# Patient Record
Sex: Male | Born: 1985 | Race: Black or African American | Hispanic: No | Marital: Single | Smoking: Never smoker
Health system: Southern US, Community
[De-identification: ages and names within clinical notes are randomized; demographics above are authoritative.]

## PROBLEM LIST (undated history)

## (undated) DIAGNOSIS — F419 Anxiety disorder, unspecified: Secondary | ICD-10-CM

---
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
## 2004-05-03 ENCOUNTER — Emergency Department (HOSPITAL_COMMUNITY): Admission: EM | Admit: 2004-05-03 | Discharge: 2004-05-03 | Payer: Self-pay

## 2004-05-18 ENCOUNTER — Emergency Department (HOSPITAL_COMMUNITY): Admission: EM | Admit: 2004-05-18 | Discharge: 2004-05-18 | Payer: Self-pay | Admitting: Emergency Medicine

## 2004-12-18 ENCOUNTER — Emergency Department (HOSPITAL_COMMUNITY): Admission: EM | Admit: 2004-12-18 | Discharge: 2004-12-18 | Payer: Self-pay | Admitting: *Deleted

## 2005-02-20 ENCOUNTER — Ambulatory Visit: Payer: Self-pay | Admitting: Family Medicine

## 2005-09-24 ENCOUNTER — Ambulatory Visit: Payer: Self-pay | Admitting: Sports Medicine

## 2006-05-08 ENCOUNTER — Emergency Department (HOSPITAL_COMMUNITY): Admission: EM | Admit: 2006-05-08 | Discharge: 2006-05-08 | Payer: Self-pay | Admitting: Family Medicine

## 2006-05-22 ENCOUNTER — Emergency Department (HOSPITAL_COMMUNITY): Admission: EM | Admit: 2006-05-22 | Discharge: 2006-05-22 | Payer: Self-pay | Admitting: Emergency Medicine

## 2006-07-08 ENCOUNTER — Emergency Department (HOSPITAL_COMMUNITY): Admission: EM | Admit: 2006-07-08 | Discharge: 2006-07-08 | Payer: Self-pay | Admitting: Emergency Medicine

## 2006-07-09 ENCOUNTER — Emergency Department (HOSPITAL_COMMUNITY): Admission: EM | Admit: 2006-07-09 | Discharge: 2006-07-09 | Payer: Self-pay | Admitting: Emergency Medicine

## 2006-11-01 ENCOUNTER — Emergency Department (HOSPITAL_COMMUNITY): Admission: EM | Admit: 2006-11-01 | Discharge: 2006-11-01 | Payer: Self-pay | Admitting: Emergency Medicine

## 2006-11-02 ENCOUNTER — Emergency Department (HOSPITAL_COMMUNITY): Admission: EM | Admit: 2006-11-02 | Discharge: 2006-11-02 | Payer: Self-pay | Admitting: Emergency Medicine

## 2006-12-23 DIAGNOSIS — J4540 Moderate persistent asthma, uncomplicated: Secondary | ICD-10-CM

## 2007-11-23 ENCOUNTER — Emergency Department (HOSPITAL_COMMUNITY): Admission: EM | Admit: 2007-11-23 | Discharge: 2007-11-23 | Payer: Self-pay | Admitting: Emergency Medicine

## 2009-02-08 ENCOUNTER — Emergency Department (HOSPITAL_COMMUNITY): Admission: EM | Admit: 2009-02-08 | Discharge: 2009-02-08 | Payer: Self-pay | Admitting: Emergency Medicine

## 2009-04-29 ENCOUNTER — Emergency Department (HOSPITAL_COMMUNITY): Admission: EM | Admit: 2009-04-29 | Discharge: 2009-04-29 | Payer: Self-pay | Admitting: Emergency Medicine

## 2009-05-02 ENCOUNTER — Emergency Department (HOSPITAL_COMMUNITY): Admission: EM | Admit: 2009-05-02 | Discharge: 2009-05-02 | Payer: Self-pay | Admitting: Family Medicine

## 2010-05-20 ENCOUNTER — Emergency Department (HOSPITAL_COMMUNITY): Admission: EM | Admit: 2010-05-20 | Discharge: 2010-05-20 | Payer: Self-pay | Admitting: Family Medicine

## 2011-02-01 LAB — CULTURE, ROUTINE-ABSCESS

## 2011-06-25 ENCOUNTER — Emergency Department (HOSPITAL_COMMUNITY): Payer: Self-pay

## 2011-06-25 ENCOUNTER — Emergency Department (HOSPITAL_COMMUNITY)
Admission: EM | Admit: 2011-06-25 | Discharge: 2011-06-25 | Disposition: A | Discharge status: Home / Self Care | Payer: Self-pay | Attending: Emergency Medicine | Admitting: Emergency Medicine

## 2011-06-25 DIAGNOSIS — R110 Nausea: Secondary | ICD-10-CM | POA: Insufficient documentation

## 2011-06-25 DIAGNOSIS — K5900 Constipation, unspecified: Secondary | ICD-10-CM | POA: Insufficient documentation

## 2011-06-25 DIAGNOSIS — R109 Unspecified abdominal pain: Secondary | ICD-10-CM | POA: Insufficient documentation

## 2011-06-25 LAB — COMPREHENSIVE METABOLIC PANEL
ALT: 27 U/L (ref 0–53)
AST: 24 U/L (ref 0–37)
Albumin: 3.8 g/dL (ref 3.5–5.2)
Alkaline Phosphatase: 53 U/L (ref 39–117)
BUN: 22 mg/dL (ref 6–23)
CO2: 28 mEq/L (ref 19–32)
Calcium: 9.4 mg/dL (ref 8.4–10.5)
Chloride: 104 mEq/L (ref 96–112)
Creatinine, Ser: 0.78 mg/dL (ref 0.50–1.35)
GFR calc Af Amer: >60 mL/min (ref >60)
GFR calc non Af Amer: >60 mL/min (ref >60)
Glucose, Bld: 93 mg/dL (ref 70–99)
Potassium: 3.4 mEq/L — ABNORMAL LOW (ref 3.5–5.1)
Sodium: 140 mEq/L (ref 135–145)
Total Bilirubin: 0.8 mg/dL (ref 0.3–1.2)
Total Protein: 7.5 g/dL (ref 6.0–8.3)

## 2011-06-25 LAB — LIPASE, BLOOD: Lipase: 25 U/L (ref 11–59)

## 2011-06-25 LAB — CBC
HCT: 38.9 % — ABNORMAL LOW (ref 39.0–52.0)
Hemoglobin: 14.2 g/dL (ref 13.0–17.0)
MCH: 28.7 pg (ref 26.0–34.0)
MCHC: 36.5 g/dL — ABNORMAL HIGH (ref 30.0–36.0)
MCV: 78.6 fL (ref 78.0–100.0)
Platelets: 262 10*3/uL (ref 150–400)
RBC: 4.95 MIL/uL (ref 4.22–5.81)
RDW: 14 % (ref 11.5–15.5)
WBC: 7.5 10*3/uL (ref 4.0–10.5)

## 2011-07-16 LAB — DIFFERENTIAL
Basophils Absolute: 0
Basophils Relative: 0
Eosinophils Absolute: 0.1
Eosinophils Relative: 1
Lymphocytes Relative: 14
Lymphs Abs: 1.1
Monocytes Absolute: 0.5
Monocytes Relative: 6
Neutro Abs: 6
Neutrophils Relative %: 79 — ABNORMAL HIGH

## 2011-07-16 LAB — URINALYSIS, ROUTINE W REFLEX MICROSCOPIC
Bilirubin Urine: NEGATIVE
Glucose, UA: NEGATIVE
Hgb urine dipstick: NEGATIVE
Ketones, ur: 40 — AB
Nitrite: NEGATIVE
Protein, ur: 30 — AB
Specific Gravity, Urine: 1.033 — ABNORMAL HIGH
Urobilinogen, UA: 1
pH: 8.5 — ABNORMAL HIGH

## 2011-07-16 LAB — CBC
HCT: 44.2
Hemoglobin: 15.5
MCHC: 35.2
MCV: 81.8
Platelets: 268
RBC: 5.4
RDW: 14.4
WBC: 7.6

## 2011-07-16 LAB — COMPREHENSIVE METABOLIC PANEL
ALT: 19
AST: 18
Albumin: 4.3
Alkaline Phosphatase: 41
BUN: 19
CO2: 28
Calcium: 9.1
Chloride: 103
Creatinine, Ser: 0.88
GFR calc Af Amer: >60
GFR calc non Af Amer: >60
Glucose, Bld: 90
Potassium: 4
Sodium: 138
Total Bilirubin: 1.4 — ABNORMAL HIGH
Total Protein: 7.4

## 2011-07-16 LAB — URINE MICROSCOPIC-ADD ON

## 2011-07-16 LAB — LIPASE, BLOOD: Lipase: 19

## 2011-11-05 ENCOUNTER — Emergency Department (HOSPITAL_COMMUNITY)
Admission: EM | Admit: 2011-11-05 | Discharge: 2011-11-05 | Discharge status: Against Medical Advice | Payer: Self-pay | Attending: Emergency Medicine | Admitting: Emergency Medicine

## 2011-11-05 ENCOUNTER — Emergency Department (HOSPITAL_COMMUNITY)
Admission: EM | Admit: 2011-11-05 | Discharge: 2011-11-05 | Disposition: A | Discharge status: Home / Self Care | Payer: Self-pay | Attending: Emergency Medicine | Admitting: Emergency Medicine

## 2011-11-05 ENCOUNTER — Other Ambulatory Visit: Payer: Self-pay

## 2011-11-05 ENCOUNTER — Encounter (HOSPITAL_COMMUNITY): Payer: Self-pay | Admitting: *Deleted

## 2011-11-05 DIAGNOSIS — R030 Elevated blood-pressure reading, without diagnosis of hypertension: Secondary | ICD-10-CM | POA: Insufficient documentation

## 2011-11-05 DIAGNOSIS — R51 Headache: Secondary | ICD-10-CM | POA: Insufficient documentation

## 2011-11-05 MED ORDER — KETOROLAC TROMETHAMINE 30 MG/ML IJ SOLN
30.0000 mg | Freq: Once | INTRAMUSCULAR | Status: AC
  Administered 2011-11-05: 30 mg via INTRAVENOUS
  Filled 2011-11-05: qty 1

## 2011-11-05 NOTE — ED Notes (Signed)
Patient is resting comfortably. 

## 2011-11-05 NOTE — ED Notes (Signed)
MD at bedside. 

## 2011-11-05 NOTE — ED Notes (Signed)
Pt states he has had a headache for about a week. Pt states last time he went doctor he was dx with htn. Pt denies chest pain or n/v

## 2011-11-05 NOTE — ED Notes (Signed)
Vital signs stable. 

## 2011-11-05 NOTE — ED Provider Notes (Signed)
History     CSN: [id number]  Arrival date & time 11/05/11  1114   First MD Initiated Contact with Patient 11/05/11 1313      Chief Complaint  Patient presents with  . Hypertension    (Consider location/radiation/quality/duration/timing/severity/associated sxs/prior treatment) Patient is a 26 y.o. male presenting with headaches. The history is provided by the patient.  Headache  This is a recurrent problem. The current episode started more than 2 days ago. The problem occurs constantly. The problem has not changed since onset.The headache is associated with nothing. The pain is located in the temporal and frontal region. The quality of the pain is described as throbbing and dull. The pain is moderate. The pain does not radiate. Pertinent negatives include no anorexia, no fever, no malaise/fatigue, no chest pressure, no near-syncope, no orthopnea, no palpitations, no syncope, no shortness of breath, no nausea and no vomiting. He has tried nothing for the symptoms.   Pt states he was seen at the health dept about a week ago and was told that his BP was high; he was not started on any meds. Has not checked his BP at home since then. States that since that time he has had intermittent headaches which are described as dull and achy in nature. He denies any nausea, vomiting, photophobia, phonophobia, visual disturbance. No hx migraine, chronic HAs. He has not taken any medications at home.  Past Medical History  Diagnosis Date  . Hypertension     History reviewed. No pertinent past surgical history.  No family history on file.  History  Substance Use Topics  . Smoking status: Never Smoker   . Smokeless tobacco: Not on file  . Alcohol Use: Yes     occa      Review of Systems  Constitutional: Negative for fever, chills, malaise/fatigue, activity change, appetite change and fatigue.  HENT: Negative for congestion, sore throat, facial swelling, rhinorrhea, trouble swallowing, neck  pain, neck stiffness and tinnitus.   Eyes: Negative for photophobia and visual disturbance.  Respiratory: Negative for chest tightness and shortness of breath.   Cardiovascular: Negative for chest pain, palpitations, orthopnea, syncope and near-syncope.  Gastrointestinal: Negative for nausea, vomiting, abdominal pain and anorexia.  Musculoskeletal: Negative for myalgias.  Neurological: Positive for headaches. Negative for dizziness, speech difficulty, light-headedness and numbness.    Allergies  Review of patient's allergies indicates no known allergies.  Home Medications  No current outpatient prescriptions on file.  BP 160/84  Pulse 74  Temp 98.2 F (36.8 C)  Resp 20  Ht 5\' 7"  (1.702 m)  Wt 160 lb (72.576 kg)  BMI 25.06 kg/m2  SpO2 100%  Physical Exam  Nursing note and vitals reviewed. Constitutional: He is oriented to person, place, and time. He appears well-developed and well-nourished. No distress.  HENT:  Head: Normocephalic and atraumatic.  Mouth/Throat: Oropharynx is clear and moist. No oropharyngeal exudate.  Eyes: Conjunctivae and EOM are normal. Pupils are equal, round, and reactive to light. Right eye exhibits no discharge. Left eye exhibits no discharge.  Neck: Normal range of motion.  Cardiovascular: Normal rate, regular rhythm and normal heart sounds.   No murmur heard. Pulmonary/Chest: Effort normal and breath sounds normal. No respiratory distress. He exhibits no tenderness.  Musculoskeletal: Normal range of motion.  Neurological: He is alert and oriented to person, place, and time. No cranial nerve deficit. Coordination normal.  Skin: Skin is warm and dry. He is not diaphoretic.  Psychiatric: He has a normal mood and affect.  ED Course  Procedures (including critical care time)   Date: 11/05/2011  Rate: 88  Rhythm: normal sinus rhythm  QRS Axis: right  Intervals: normal  ST/T Wave abnormalities: normal  Conduction Disutrbances:none  Narrative  Interpretation:   Old EKG Reviewed: as compared with July 2005 no significant changes  Labs Reviewed - No data to display No results found.   1. Headache       MDM  Pt with sx consistent with tension HA; this has been intermittent in nature over the past week. Per pt, BP measured at urgent care was elevated about a week ago. On exam here, no focal neuro findings. He was given Toradol and responded well to this. BP on d/c was 126/80. Discussed establishing care with a PCP for f/u. Return precautions discussed. Pt verbalized understanding and agreed to plan.       Grant Fontana, [place] 11/05/11 1452

## 2011-11-05 NOTE — ED Notes (Signed)
Patient denies pain and is resting comfortably.  

## 2011-11-06 NOTE — ED Provider Notes (Signed)
Evaluation and management procedures were performed by the PA/NP under my supervision/collaboration.    Felisa Bonier, MD 11/06/11 1725

## 2011-12-11 ENCOUNTER — Emergency Department (INDEPENDENT_AMBULATORY_CARE_PROVIDER_SITE_OTHER)
Admission: EM | Admit: 2011-12-11 | Discharge: 2011-12-11 | Disposition: A | Discharge status: Home / Self Care | Payer: Self-pay | Source: Home / Self Care

## 2011-12-11 ENCOUNTER — Encounter (HOSPITAL_COMMUNITY): Payer: Self-pay | Admitting: Cardiology

## 2011-12-11 DIAGNOSIS — J029 Acute pharyngitis, unspecified: Secondary | ICD-10-CM

## 2011-12-11 MED ORDER — IBUPROFEN 800 MG PO TABS: 800.0000 mg | ORAL_TABLET | Freq: Three times a day (TID) | ORAL | Status: DC

## 2011-12-11 NOTE — Discharge Instructions (Signed)
Take Ibuprofen as prescribed for discomfort. You may also use Chloraseptic spray for your throat discomfort. Increase fluids.  Return if symptoms change or worsen.

## 2011-12-11 NOTE — ED Provider Notes (Signed)
History     CSN: [id number]  Arrival date & time 12/11/11  4098   None     Chief Complaint  Patient presents with  . Sore Throat  . Nasal Congestion    (Consider location/radiation/quality/duration/timing/severity/associated sxs/prior treatment) HPI Comments: Pt presents today with c/o sore throat and nasal congestion. He states his primary concern is his sore throat and the nasal congestion is mild. He is concerned he has strep throat. This feels like a strep throat infection he had in the past. Pain worsens with swallowing. He girlfriend currently has a sore throat also. No fever, chills, or cough. No relief with otc cold medication.    Past Medical History  Diagnosis Date  . Hypertension   . Asthma     History reviewed. No pertinent past surgical history.  Family History  Problem Relation Age of Onset  . Hypertension Other   . Diabetes Other     History  Substance Use Topics  . Smoking status: Never Smoker   . Smokeless tobacco: Not on file  . Alcohol Use: Yes     occa      Review of Systems  Constitutional: Negative for fever, chills and fatigue.  HENT: Positive for congestion, sore throat and rhinorrhea. Negative for ear pain, sneezing, postnasal drip and sinus pressure.   Respiratory: Negative for cough, shortness of breath and wheezing.   Cardiovascular: Negative for chest pain.    Allergies  Review of patient's allergies indicates no known allergies.  Home Medications   Current Outpatient Rx  Name Route Sig Dispense Refill  . IBUPROFEN 800 MG PO TABS Oral Take 1 tablet (800 mg total) by mouth 3 (three) times daily. 15 tablet 0    There were no vitals taken for this visit.  Physical Exam  Nursing note and vitals reviewed. Constitutional: He appears well-developed and well-nourished. No distress.  HENT:  Head: Normocephalic and atraumatic.  Right Ear: Tympanic membrane, external ear and ear canal normal.  Left Ear: Tympanic membrane, external  ear and ear canal normal.  Nose: Nose normal.  Mouth/Throat: Uvula is midline and mucous membranes are normal. Posterior oropharyngeal erythema present. No oropharyngeal exudate or posterior oropharyngeal edema.  Neck: Neck supple.  Cardiovascular: Normal rate, regular rhythm and normal heart sounds.   Pulmonary/Chest: Effort normal and breath sounds normal. No respiratory distress.  Lymphadenopathy:    He has no cervical adenopathy.  Neurological: He is alert.  Skin: Skin is warm and dry.  Psychiatric: He has a normal mood and affect.    ED Course  Procedures (including critical care time)   Labs Reviewed  POCT RAPID STREP A ([facility] ONLY)   No results found.   1. Acute pharyngitis       MDM  Rapid strep neg.  Discussed with pt that this appears to be a viral throat infection. No abx rx at this time. Return if symptoms change or worsen.          Melody Comas, [place] 12/11/11 1102

## 2011-12-11 NOTE — ED Notes (Signed)
Sore throat and nasal congestion since this past Wednesday. Tried OTC liquid for flu symptoms with no relief. Denies fever. Tolerating PO intake.

## 2011-12-11 NOTE — ED Provider Notes (Signed)
Medical screening examination/treatment/procedure(s) were performed by non-physician practitioner and as supervising physician I was immediately available for consultation/collaboration.  Izan Miron M. MD   Shelli Portilla M Manaal Mandala, MD 12/11/11 2123 

## 2011-12-14 ENCOUNTER — Emergency Department (HOSPITAL_COMMUNITY)
Admission: EM | Admit: 2011-12-14 | Discharge: 2011-12-14 | Disposition: A | Discharge status: Home Health | Payer: Self-pay | Attending: Emergency Medicine | Admitting: Emergency Medicine

## 2011-12-14 ENCOUNTER — Encounter (HOSPITAL_COMMUNITY): Payer: Self-pay | Admitting: Emergency Medicine

## 2011-12-14 DIAGNOSIS — J45909 Unspecified asthma, uncomplicated: Secondary | ICD-10-CM | POA: Insufficient documentation

## 2011-12-14 DIAGNOSIS — J029 Acute pharyngitis, unspecified: Secondary | ICD-10-CM | POA: Insufficient documentation

## 2011-12-14 DIAGNOSIS — J069 Acute upper respiratory infection, unspecified: Secondary | ICD-10-CM | POA: Insufficient documentation

## 2011-12-14 DIAGNOSIS — R059 Cough, unspecified: Secondary | ICD-10-CM | POA: Insufficient documentation

## 2011-12-14 DIAGNOSIS — I10 Essential (primary) hypertension: Secondary | ICD-10-CM | POA: Insufficient documentation

## 2011-12-14 DIAGNOSIS — R05 Cough: Secondary | ICD-10-CM | POA: Insufficient documentation

## 2011-12-14 DIAGNOSIS — J3489 Other specified disorders of nose and nasal sinuses: Secondary | ICD-10-CM | POA: Insufficient documentation

## 2011-12-14 NOTE — ED Notes (Signed)
Pt came to get work note for extra days. St's he still has a cough and runny nose

## 2011-12-14 NOTE — Discharge Instructions (Signed)

## 2011-12-14 NOTE — ED Notes (Signed)
Pt. Came in for a work note extension.  States he got a note to return to work today but was still not feeling well. I told he would have to check back in and see the doctor to get note extended. Vassie Moselle 12/14/2011

## 2011-12-14 NOTE — ED Provider Notes (Signed)
History     CSN: [id number]  Arrival date & time 12/14/11  4098   First MD Initiated Contact with Patient 12/14/11 1927      Chief Complaint  Patient presents with  . Cough    (Consider location/radiation/quality/duration/timing/severity/associated sxs/prior treatment) HPI Comments: Patient presents with a 3 to four-day history of URI symptoms. It started about 4 days ago with a sore throat. He was actually seen here and diagnosed with viral pharyngitis. The next day he started having some nasal congestion and he's continuing to have some nasal congestion and mild cough denies any significant chest congestion. Denies a fevers. Denies any nausea vomiting. Denies a shortness of breath.  The history is provided by the patient.    Past Medical History  Diagnosis Date  . Hypertension   . Asthma     History reviewed. No pertinent past surgical history.  Family History  Problem Relation Age of Onset  . Hypertension Other   . Diabetes Other     History  Substance Use Topics  . Smoking status: Never Smoker   . Smokeless tobacco: Not on file  . Alcohol Use: Yes     occa      Review of Systems  Constitutional: Positive for fatigue. Negative for fever, chills and diaphoresis.  HENT: Positive for congestion, rhinorrhea and sneezing.   Eyes: Negative.   Respiratory: Positive for cough. Negative for chest tightness and shortness of breath.   Cardiovascular: Negative for chest pain and leg swelling.  Gastrointestinal: Negative for nausea, vomiting, abdominal pain, diarrhea and blood in stool.  Genitourinary: Negative for frequency, hematuria, flank pain and difficulty urinating.  Musculoskeletal: Negative for back pain and arthralgias.  Skin: Negative for rash.  Neurological: Negative for dizziness, speech difficulty, weakness, numbness and headaches.    Allergies  Review of patient's allergies indicates no known allergies.  Home Medications   Current Outpatient Rx    Name Route Sig Dispense Refill  . OVER THE COUNTER MEDICATION Oral Take 5 mLs by mouth daily as needed. Flu therapy    . PHENYLEPH-DOXYLAMINE-DM-APAP 5-6.25-10-325 MG PO CAPS Oral Take 1 tablet by mouth 4 (four) times daily as needed. For flu      BP 156/78  Pulse 89  Temp(Src) 98 F (36.7 C) (Oral)  Resp 16  SpO2 96%  Physical Exam  Constitutional: He is oriented to person, place, and time. He appears well-developed and well-nourished.  HENT:  Head: Normocephalic and atraumatic.  Right Ear: External ear normal.  Left Ear: External ear normal.  Mouth/Throat: Oropharynx is clear and moist.  Eyes: Conjunctivae are normal. Pupils are equal, round, and reactive to light.  Neck: Normal range of motion. Neck supple.  Cardiovascular: Normal rate, regular rhythm and normal heart sounds.   Pulmonary/Chest: Effort normal and breath sounds normal. No respiratory distress. He has no wheezes. He has no rales. He exhibits no tenderness.  Abdominal: Soft. Bowel sounds are normal. There is no tenderness. There is no rebound and no guarding.  Musculoskeletal: Normal range of motion. He exhibits no edema.  Lymphadenopathy:    He has no cervical adenopathy.  Neurological: He is alert and oriented to person, place, and time.  Skin: Skin is warm and dry. No rash noted.  Psychiatric: He has a normal mood and affect.    ED Course  Procedures (including critical care time)  Labs Reviewed - No data to display No results found.   1. URI (upper respiratory infection)  MDM  Patient well appearing instructed him to use over-the-counter medicines that do not contain decongestants followup with his primary care physician as needed        Rolan Bucco, MD 12/14/11 [phone]

## 2013-10-16 ENCOUNTER — Encounter (HOSPITAL_COMMUNITY): Payer: Self-pay | Admitting: Emergency Medicine

## 2013-10-16 ENCOUNTER — Emergency Department (INDEPENDENT_AMBULATORY_CARE_PROVIDER_SITE_OTHER)
Admission: EM | Admit: 2013-10-16 | Discharge: 2013-10-16 | Disposition: A | Discharge status: Home / Self Care | Payer: Self-pay | Source: Home / Self Care | Attending: Emergency Medicine | Admitting: Emergency Medicine

## 2013-10-16 DIAGNOSIS — J029 Acute pharyngitis, unspecified: Secondary | ICD-10-CM

## 2013-10-16 LAB — POCT RAPID STREP A: Streptococcus, Group A Screen (Direct): NEGATIVE

## 2013-10-16 MED ORDER — PREDNISONE 20 MG PO TABS: 20.0000 mg | ORAL_TABLET | Freq: Two times a day (BID) | ORAL | Status: DC

## 2013-10-16 MED ORDER — TRAMADOL HCL 50 MG PO TABS: 100.0000 mg | ORAL_TABLET | Freq: Three times a day (TID) | ORAL | Status: DC | PRN

## 2013-10-16 NOTE — ED Provider Notes (Signed)
Chief Complaint:   Chief Complaint  Patient presents with  . Sore Throat    History of Present Illness:   Jose Hall is a 27 year old male who has had a two-day history of sore throat and pain on swallowing. He denies any fever, chills, sweats, or myalgias. He's had no headache, sneezing, nasal congestion, rhinorrhea. He denies any stiff neck or adenopathy. He has had no coughing, wheezing, or chest pain. He denies any GI symptoms. He has had no known sick exposures.  Review of Systems:  Other than as noted above, the patient denies any of the following symptoms. Systemic:  No fever, chills, sweats, myalgias, or headache. Eye:  No redness, pain or drainage. ENT:  No earache, nasal congestion, sneezing, rhinorrhea, sinus pressure, sinus pain, or post nasal drip. Lungs:  No cough, sputum production, wheezing, shortness of breath, or chest pain. GI:  No abdominal pain, nausea, vomiting, or diarrhea. Skin:  No rash.  PMFSH:  Past medical history, family history, social history, meds, allergies, and nurse's notes were reviewed.  There is no known exposure to strep or mono.  No prior history of step or mono.  The patient denies use of tobacco.  Physical Exam:   Vital signs:  BP 119/74  Pulse 67  Temp(Src) 98 F (36.7 C) (Oral)  Resp 16  SpO2 100% General:  Alert, in no distress. Phonation was normal, no drooling, and patient was able to handle secretions well.  Eye:  No conjunctival injection or drainage. Lids were normal. ENT:  TMs and canals were normal, without erythema or inflammation.  Nasal mucosa was clear and uncongested, without drainage.  Mucous membranes were moist.  Exam of pharynx reveals erythema but no exudate, swelling, or ulcerations.  There were no oral ulcerations or lesions. There was no bulging of the tonsillar pillars, and the uvula was midline. Neck:  Supple, no adenopathy, tenderness or mass. Lungs:  No respiratory distress.  Lungs were clear to auscultation, without  wheezes, rales or rhonchi.  Breath sounds were clear and equal bilaterally.  Heart:  Regular rhythm, without gallops, murmers or rubs. Skin:  Clear, warm, and dry, without rash or lesions.  Labs:   Results for orders placed during the hospital encounter of 10/16/13  POCT RAPID STREP A ([facility] ONLY)      Result Value Range   Streptococcus, Group A Screen (Direct) NEGATIVE  NEGATIVE   Assessment:  The encounter diagnosis was Viral pharyngitis.  There is no evidence of a peritonsillar abscess. No indication for antibiotics.  Plan:   1.  Meds:  The following meds were prescribed:   Discharge Medication List as of 10/16/2013 11:37 AM    START taking these medications   Details  predniSONE (DELTASONE) 20 MG tablet Take 1 tablet (20 mg total) by mouth 2 (two) times daily., Starting 10/16/2013, Until Discontinued, Normal    traMADol (ULTRAM) 50 MG tablet Take 2 tablets (100 mg total) by mouth every 8 (eight) hours as needed., Starting 10/16/2013, Until Discontinued, Normal        2.  Patient Education/Counseling:  The patient was given appropriate handouts, self care instructions, and instructed in symptomatic relief, including hot saline gargles, throat lozenges, infectious precautions, and need to trade out toothbrush.   3.  Follow up:  The patient was told to follow up if no better in 3 to 4 days, or sooner if becoming worse in any way, and given some red flag symptoms such as difficulty swallowing or  breathing which would prompt immediate return.  Follow up here as needed.     Reuben Likes, MD 10/16/13 2214

## 2013-10-16 NOTE — ED Notes (Signed)
Pt  Reports  Symptoms  Of  sorethroat  With  Pain  When  He  Swallows        X  2  Days         Pt denys  Any  Other  Symptoms    He is  Sitting  Upright on  Exam table  Speaking in  Complete  sentances

## 2013-10-18 ENCOUNTER — Encounter (HOSPITAL_COMMUNITY): Payer: Self-pay | Admitting: Emergency Medicine

## 2013-10-18 ENCOUNTER — Emergency Department (HOSPITAL_COMMUNITY)
Admission: EM | Admit: 2013-10-18 | Discharge: 2013-10-18 | Disposition: A | Discharge status: Home / Self Care | Payer: Self-pay | Attending: Emergency Medicine | Admitting: Emergency Medicine

## 2013-10-18 DIAGNOSIS — IMO0002 Reserved for concepts with insufficient information to code with codable children: Secondary | ICD-10-CM | POA: Insufficient documentation

## 2013-10-18 DIAGNOSIS — I10 Essential (primary) hypertension: Secondary | ICD-10-CM | POA: Insufficient documentation

## 2013-10-18 DIAGNOSIS — R112 Nausea with vomiting, unspecified: Secondary | ICD-10-CM | POA: Insufficient documentation

## 2013-10-18 DIAGNOSIS — J45909 Unspecified asthma, uncomplicated: Secondary | ICD-10-CM | POA: Insufficient documentation

## 2013-10-18 DIAGNOSIS — J069 Acute upper respiratory infection, unspecified: Secondary | ICD-10-CM | POA: Insufficient documentation

## 2013-10-18 DIAGNOSIS — R109 Unspecified abdominal pain: Secondary | ICD-10-CM | POA: Insufficient documentation

## 2013-10-18 DIAGNOSIS — IMO0001 Reserved for inherently not codable concepts without codable children: Secondary | ICD-10-CM | POA: Insufficient documentation

## 2013-10-18 LAB — CULTURE, GROUP A STREP

## 2013-10-18 LAB — URINALYSIS, ROUTINE W REFLEX MICROSCOPIC
Bilirubin Urine: NEGATIVE
Glucose, UA: NEGATIVE mg/dL
Ketones, ur: NEGATIVE mg/dL
Leukocytes, UA: NEGATIVE
Nitrite: NEGATIVE
Protein, ur: NEGATIVE mg/dL

## 2013-10-18 MED ORDER — ONDANSETRON HCL 4 MG/2ML IJ SOLN: 4.0000 mg | Freq: Once | INTRAMUSCULAR | Status: DC

## 2013-10-18 MED ORDER — SODIUM CHLORIDE 0.9 % IV BOLUS (SEPSIS)
1000.0000 mL | Freq: Once | INTRAVENOUS | Status: AC
  Administered 2013-10-18: 1000 mL via INTRAVENOUS

## 2013-10-18 NOTE — ED Provider Notes (Signed)
CSN: [id number]     Arrival date & time 10/18/13  0211 History   First MD Initiated Contact with Patient 10/18/13 0253     Chief Complaint  Patient presents with  . Cough   (Consider location/radiation/quality/duration/timing/severity/associated sxs/prior Treatment) Patient is a 27 y.o. male presenting with URI. The history is provided by the patient. No language interpreter was used.  URI Presenting symptoms: congestion, cough and sore throat   Presenting symptoms: no fever   Associated symptoms: myalgias   Associated symptoms comment:  He started having a sore throat and nasal congestion 5 days ago without fever. He was seen at Urgent care 3 days ago, reports a negative Strep and given Ultram and prednisone for symptoms. He stopped taking these medications after one dose. He reports that the symptoms have progressed to include body aches, continued congestion, improved sore throat and constant nausea with one episode of vomiting. No diarrhea. He has discomfort across lower abdomen. He denies dysuria or penile discharge. No known fever at any time.   Past Medical History  Diagnosis Date  . Hypertension   . Asthma    History reviewed. No pertinent past surgical history. Family History  Problem Relation Age of Onset  . Hypertension Other   . Diabetes Other    History  Substance Use Topics  . Smoking status: Never Smoker   . Smokeless tobacco: Not on file  . Alcohol Use: Yes     Comment: occa    Review of Systems  Constitutional: Negative for fever and chills.  HENT: Positive for congestion and sore throat.   Respiratory: Positive for cough.   Cardiovascular: Negative.   Gastrointestinal: Positive for nausea, vomiting and abdominal pain. Negative for diarrhea and constipation.  Genitourinary: Negative.  Negative for dysuria and discharge.  Musculoskeletal: Positive for myalgias.  Skin: Negative.  Negative for rash.  Neurological: Negative.     Allergies  Review of  patient's allergies indicates no known allergies.  Home Medications   Current Outpatient Rx  Name  Route  Sig  Dispense  Refill  . Phenyleph-Doxylamine-DM-APAP ([facility]) 5-6.25-10-325 MG CAPS   Oral   Take 1 tablet by mouth 4 (four) times daily as needed. For flu         . predniSONE (DELTASONE) 20 MG tablet   Oral   Take 1 tablet (20 mg total) by mouth 2 (two) times daily.   10 tablet   0   . traMADol (ULTRAM) 50 MG tablet   Oral   Take 2 tablets (100 mg total) by mouth every 8 (eight) hours as needed.   30 tablet   0    BP 148/90  Pulse 90  Temp(Src) 98.3 F (36.8 C) (Oral)  Resp 18  SpO2 100% Physical Exam  Constitutional: He is oriented to person, place, and time. He appears well-developed and well-nourished.  HENT:  Head: Normocephalic.  Mouth/Throat: Oropharynx is clear and moist. No oropharyngeal exudate.  Eyes: Conjunctivae are normal.  Neck: Normal range of motion. Neck supple.  Cardiovascular: Normal rate and regular rhythm.   No murmur heard. Pulmonary/Chest: Effort normal and breath sounds normal.  Abdominal: Soft. Bowel sounds are normal. There is tenderness. There is no rebound and no guarding.  Tender to left and right lower quadrants without guarding. No mass.  Musculoskeletal: Normal range of motion.  Neurological: He is alert and oriented to person, place, and time.  Skin: Skin is warm and dry. No rash noted.  Psychiatric: He  has a normal mood and affect.    ED Course  Procedures (including critical care time) Labs Review Labs Reviewed  URINALYSIS, ROUTINE W REFLEX MICROSCOPIC   Results for orders placed during the hospital encounter of 10/18/13  URINALYSIS, ROUTINE W REFLEX MICROSCOPIC      Result Value Range   Color, Urine YELLOW  YELLOW   APPearance CLEAR  CLEAR   Specific Gravity, Urine 1.008  1.005 - 1.030   pH 7.0  5.0 - 8.0   Glucose, UA NEGATIVE  NEGATIVE mg/dL   Hgb urine dipstick NEGATIVE  NEGATIVE    Bilirubin Urine NEGATIVE  NEGATIVE   Ketones, ur NEGATIVE  NEGATIVE mg/dL   Protein, ur NEGATIVE  NEGATIVE mg/dL   Urobilinogen, UA 0.2  0.0 - 1.0 mg/dL   Nitrite NEGATIVE  NEGATIVE   Leukocytes, UA NEGATIVE  NEGATIVE    Imaging Review No results found.  EKG Interpretation   None       MDM  No diagnosis found. 1. URI 2. Abdominal pain  Abdominal pain resolved on re-examination. His abdomen is nontender to palpation. Urinalysis negative. He reports feeling better and ready to go home to his own bed.  Arnoldo Hooker, PA-C 10/18/13 [phone]

## 2013-10-18 NOTE — ED Notes (Signed)
Pt c/o pain in abdomen in the left and right lower quadrants, denies changes in urine, no blood in urine, Pt LBM [id number], 10/10 pain

## 2013-10-18 NOTE — ED Notes (Signed)
Pt comfortable with d/c and f/u instructions. No prescriptions 

## 2013-10-18 NOTE — ED Notes (Signed)
The pt has had a sorethroat since yesterday with a cold.  He [facility].  Now his stomach is burning since he took alka-seltzer and since then he has had this abd pain.  He no longer has a sore throat

## 2013-10-18 NOTE — ED Provider Notes (Signed)
Medical screening examination/treatment/procedure(s) were performed by non-physician practitioner and as supervising physician I was immediately available for consultation/collaboration.  EKG Interpretation   None       Devoria Albe, MD, [facility]   Ward Givens, MD 10/18/13 [phone]

## 2015-10-06 ENCOUNTER — Emergency Department (HOSPITAL_COMMUNITY)
Admission: EM | Admit: 2015-10-06 | Discharge: 2015-10-06 | Disposition: A | Discharge status: Home / Self Care | Payer: Self-pay | Source: Home / Self Care | Attending: Emergency Medicine | Admitting: Emergency Medicine

## 2015-10-06 ENCOUNTER — Encounter (HOSPITAL_COMMUNITY): Payer: Self-pay | Admitting: *Deleted

## 2015-10-06 DIAGNOSIS — R05 Cough: Secondary | ICD-10-CM

## 2015-10-06 DIAGNOSIS — R058 Other specified cough: Secondary | ICD-10-CM

## 2015-10-06 MED ORDER — PREDNISONE 50 MG PO TABS: ORAL_TABLET | ORAL | Status: DC

## 2015-10-06 MED ORDER — HYDROCODONE-HOMATROPINE 5-1.5 MG/5ML PO SYRP: 5.0000 mL | ORAL_SOLUTION | Freq: Four times a day (QID) | ORAL | Status: DC | PRN

## 2015-10-06 NOTE — ED Notes (Signed)
Pt  Reports  Symptoms  Of  Cough   /  Congestion    Cough is    Productive at  Times      Other  Times  It is  Not      She  Is sitting upright on the  Exam table  Speaking in  Complete  sentances      In no  Acute  Distress

## 2015-10-06 NOTE — Discharge Instructions (Signed)
The cough is coming from lingering inflammation from the cold you have last week. Take prednisone daily for 5 days. Use Hycodan every 4 hours as needed for cough. Do not drive a taking this medicine. You should see improvement in the cough over the next 5 days. It may take another 2-3 weeks for it to fully go away. If you develop fevers, shortness of breath, see blood in your sputum, please come back.

## 2015-10-06 NOTE — ED Provider Notes (Signed)
CSN: [id number]     Arrival date & time 10/06/15  1308 History   First MD Initiated Contact with Patient 10/06/15 1406     Chief Complaint  Patient presents with  . Cough   (Consider location/radiation/quality/duration/timing/severity/associated sxs/prior Treatment) HPI He is a 29 year old man here for evaluation of cough. He states his symptoms started one week ago with cough, nasal congestion, sore throat. He states all of his symptoms have gotten better, except for the cough. He continues to have a "bad" cough. It is productive of sputum. He denies any wheezing or shortness of breath. No fevers or chills. No nausea or vomiting. He reports a distant history of asthma. He has not needed an inhaler in a number of years.  Past Medical History  Diagnosis Date  . Hypertension   . Asthma    No past surgical history on file. Family History  Problem Relation Age of Onset  . Hypertension Other   . Diabetes Other    Social History  Substance Use Topics  . Smoking status: Never Smoker   . Smokeless tobacco: Not on file  . Alcohol Use: Yes     Comment: occa    Review of Systems As in history of present illness Allergies  Review of patient's allergies indicates no known allergies.  Home Medications   Prior to Admission medications   Medication Sig Start Date End Date Taking? Authorizing Provider  HYDROcodone-homatropine (HYCODAN) 5-1.5 MG/5ML syrup Take 5 mLs by mouth every 6 (six) hours as needed for cough. 10/06/15   Charm RingsErin J Lanier Millon, MD  predniSONE (DELTASONE) 50 MG tablet Take 1 pill daily for 5 days. 10/06/15   Charm RingsErin J Hykeem Ojeda, MD   Meds Ordered and Administered this Visit  Medications - No data to display  BP 138/90 mmHg  Pulse 70  Temp(Src) 97.9 F (36.6 C) (Oral)  SpO2 99% No data found.   Physical Exam  Constitutional: He is oriented to person, place, and time. He appears well-developed and well-nourished. No distress.  HENT:  Nose: Nose normal.  Mouth/Throat:  Oropharynx is clear and moist.  Neck: Neck supple.  Cardiovascular: Normal rate, regular rhythm and normal heart sounds.   No murmur heard. Pulmonary/Chest: Effort normal and breath sounds normal. No respiratory distress. He has no wheezes. He has no rales.  Lymphadenopathy:    He has no cervical adenopathy.  Neurological: He is alert and oriented to person, place, and time.    ED Course  Procedures (including critical care time)  Labs Review Labs Reviewed - No data to display  Imaging Review No results found.    MDM   1. Post-viral cough syndrome    Discussed etiology of cough. No fever, lung findings, or shortness of breath to suggest pneumonia. No wheezing to suggest bronchitis. Treat with 5 days of prednisone. Hycodan as needed for coughing. Return precautions reviewed.    Charm RingsErin J Avrianna Smart, MD 10/06/15 [phone]

## 2016-01-27 ENCOUNTER — Emergency Department (HOSPITAL_COMMUNITY)
Admission: EM | Admit: 2016-01-27 | Discharge: 2016-01-27 | Disposition: A | Discharge status: Home / Self Care | Payer: Self-pay | Attending: Emergency Medicine | Admitting: Emergency Medicine

## 2016-01-27 ENCOUNTER — Encounter (HOSPITAL_COMMUNITY): Payer: Self-pay | Admitting: Emergency Medicine

## 2016-01-27 DIAGNOSIS — R197 Diarrhea, unspecified: Secondary | ICD-10-CM | POA: Insufficient documentation

## 2016-01-27 DIAGNOSIS — J45909 Unspecified asthma, uncomplicated: Secondary | ICD-10-CM | POA: Insufficient documentation

## 2016-01-27 DIAGNOSIS — R1030 Lower abdominal pain, unspecified: Secondary | ICD-10-CM

## 2016-01-27 DIAGNOSIS — Z79899 Other long term (current) drug therapy: Secondary | ICD-10-CM | POA: Insufficient documentation

## 2016-01-27 DIAGNOSIS — R1013 Epigastric pain: Secondary | ICD-10-CM | POA: Insufficient documentation

## 2016-01-27 DIAGNOSIS — Z7952 Long term (current) use of systemic steroids: Secondary | ICD-10-CM | POA: Insufficient documentation

## 2016-01-27 DIAGNOSIS — I10 Essential (primary) hypertension: Secondary | ICD-10-CM | POA: Insufficient documentation

## 2016-01-27 DIAGNOSIS — R112 Nausea with vomiting, unspecified: Secondary | ICD-10-CM

## 2016-01-27 LAB — CBC
HEMATOCRIT: 43.3 % (ref 39.0–52.0)
Hemoglobin: 16 g/dL (ref 13.0–17.0)
MCH: 28.7 pg (ref 26.0–34.0)
MCHC: 37 g/dL — AB (ref 30.0–36.0)
MCV: 77.7 fL — AB (ref 78.0–100.0)
PLATELETS: 231 10*3/uL (ref 150–400)
RBC: 5.57 MIL/uL (ref 4.22–5.81)
RDW: 14 % (ref 11.5–15.5)
WBC: 12.4 10*3/uL — AB (ref 4.0–10.5)

## 2016-01-27 LAB — COMPREHENSIVE METABOLIC PANEL
ALBUMIN: 4.6 g/dL (ref 3.5–5.0)
ALT: 42 U/L (ref 17–63)
AST: 28 U/L (ref 15–41)
Alkaline Phosphatase: 50 U/L (ref 38–126)
Anion gap: 9 (ref 5–15)
BUN: 19 mg/dL (ref 6–20)
CHLORIDE: 103 mmol/L (ref 101–111)
CO2: 24 mmol/L (ref 22–32)
CREATININE: 1.07 mg/dL (ref 0.61–1.24)
Calcium: 9.1 mg/dL (ref 8.9–10.3)
GFR calc Af Amer: >60 mL/min (ref >60)
GLUCOSE: 113 mg/dL — AB (ref 65–99)
POTASSIUM: 4 mmol/L (ref 3.5–5.1)
Sodium: 136 mmol/L (ref 135–145)
Total Bilirubin: 1.3 mg/dL — ABNORMAL HIGH (ref 0.3–1.2)
Total Protein: 8.1 g/dL (ref 6.5–8.1)

## 2016-01-27 LAB — DIFFERENTIAL
BASOS ABS: 0 10*3/uL (ref 0.0–0.1)
BASOS PCT: 0 %
Eosinophils Absolute: 0 10*3/uL (ref 0.0–0.7)
Eosinophils Relative: 0 %
LYMPHS PCT: 5 %
Lymphs Abs: 0.6 10*3/uL — ABNORMAL LOW (ref 0.7–4.0)
MONO ABS: 0.7 10*3/uL (ref 0.1–1.0)
MONOS PCT: 6 %
NEUTROS ABS: 10.9 10*3/uL — AB (ref 1.7–7.7)
Neutrophils Relative %: 89 %

## 2016-01-27 LAB — URINALYSIS, ROUTINE W REFLEX MICROSCOPIC
BILIRUBIN URINE: NEGATIVE
GLUCOSE, UA: NEGATIVE mg/dL
KETONES UR: NEGATIVE mg/dL
LEUKOCYTES UA: NEGATIVE
Nitrite: NEGATIVE
PH: 6 (ref 5.0–8.0)
PROTEIN: NEGATIVE mg/dL
Specific Gravity, Urine: 1.019 (ref 1.005–1.030)

## 2016-01-27 LAB — LIPASE, BLOOD: LIPASE: 23 U/L (ref 11–51)

## 2016-01-27 LAB — URINE MICROSCOPIC-ADD ON: Bacteria, UA: NONE SEEN

## 2016-01-27 MED ORDER — LOPERAMIDE HCL 2 MG PO CAPS: 2.0000 mg | ORAL_CAPSULE | Freq: Four times a day (QID) | ORAL | Status: DC | PRN

## 2016-01-27 MED ORDER — ONDANSETRON HCL 4 MG/2ML IJ SOLN
4.0000 mg | Freq: Once | INTRAMUSCULAR | Status: DC
  Filled 2016-01-27: qty 2

## 2016-01-27 MED ORDER — ONDANSETRON HCL 4 MG PO TABS: 4.0000 mg | ORAL_TABLET | Freq: Four times a day (QID) | ORAL | Status: DC

## 2016-01-27 MED ORDER — SODIUM CHLORIDE 0.9 % IV BOLUS (SEPSIS): 1000.0000 mL | Freq: Once | INTRAVENOUS | Status: DC

## 2016-01-27 MED ORDER — ONDANSETRON 4 MG PO TBDP
4.0000 mg | ORAL_TABLET | Freq: Once | ORAL | Status: DC
  Filled 2016-01-27: qty 1

## 2016-01-27 MED ORDER — ONDANSETRON 4 MG PO TBDP
4.0000 mg | ORAL_TABLET | Freq: Once | ORAL | Status: AC
  Administered 2016-01-27: 4 mg via ORAL

## 2016-01-27 NOTE — ED Notes (Signed)
Drank last night. Woke up with nausea, vomiting, and diarrhea this morning. C/o suprapubic pain, denies any urinary symptoms.

## 2016-01-27 NOTE — Discharge Instructions (Signed)
Take medications as prescribed. Return without fail for worsening symptoms including worsening pain, fevers, vomiting unable to keep down food or fluids despite nausea medications, or any other symptoms concerning to you.  Abdominal Pain, Adult Many things can cause belly (abdominal) pain. Most times, the belly pain is not dangerous. Many cases of belly pain can be watched and treated at home. HOME CARE   Do not take medicines that help you go poop (laxatives) unless told to by your doctor.  Only take medicine as told by your doctor.  Eat or drink as told by your doctor. Your doctor will tell you if you should be on a special diet. GET HELP IF:  You do not know what is causing your belly pain.  You have belly pain while you are sick to your stomach (nauseous) or have runny poop (diarrhea).  You have pain while you pee or poop.  Your belly pain wakes you up at night.  You have belly pain that gets worse or better when you eat.  You have belly pain that gets worse when you eat fatty foods.  You have a fever. GET HELP RIGHT AWAY IF:   The pain does not go away within 2 hours.  You keep throwing up (vomiting).  The pain changes and is only in the right or left part of the belly.  You have bloody or tarry looking poop. MAKE SURE YOU:   Understand these instructions.  Will watch your condition.  Will get help right away if you are not doing well or get worse.   This information is not intended to replace advice given to you by your health care provider. Make sure you discuss any questions you have with your health care provider.   Document Released: 03/30/2008 Document Revised: 11/02/2014 Document Reviewed: 06/21/2013 Elsevier Interactive Patient Education 2016 Elsevier Inc.  Diarrhea Diarrhea is frequent loose and watery bowel movements. It can cause you to feel weak and dehydrated. Dehydration can cause you to become tired and thirsty, have a dry mouth, and have  decreased urination that often is dark yellow. Diarrhea is a sign of another problem, most often an infection that will not last long. In most cases, diarrhea typically lasts 2-3 days. However, it can last longer if it is a sign of something more serious. It is important to treat your diarrhea as directed by your caregiver to lessen or prevent future episodes of diarrhea. CAUSES  Some common causes include:  Gastrointestinal infections caused by viruses, bacteria, or parasites.  Food poisoning or food allergies.  Certain medicines, such as antibiotics, chemotherapy, and laxatives.  Artificial sweeteners and fructose.  Digestive disorders. HOME CARE INSTRUCTIONS  Ensure adequate fluid intake (hydration): Have 1 cup (8 oz) of fluid for each diarrhea episode. Avoid fluids that contain simple sugars or sports drinks, fruit juices, whole milk products, and sodas. Your urine should be clear or pale yellow if you are drinking enough fluids. Hydrate with an oral rehydration solution that you can purchase at pharmacies, retail stores, and online. You can prepare an oral rehydration solution at home by mixing the following ingredients together:   - tsp table salt.   tsp baking soda.   tsp salt substitute containing potassium chloride.  1  tablespoons sugar.  1 L (34 oz) of water.  Certain foods and beverages may increase the speed at which food moves through the gastrointestinal (GI) tract. These foods and beverages should be avoided and include:  Caffeinated and alcoholic beverages.  High-fiber foods, such as raw fruits and vegetables, nuts, seeds, and whole grain breads and cereals.  Foods and beverages sweetened with sugar alcohols, such as xylitol, sorbitol, and mannitol.  Some foods may be well tolerated and may help thicken stool including:  Starchy foods, such as rice, toast, pasta, low-sugar cereal, oatmeal, grits, baked potatoes, crackers, and  bagels.  Bananas.  Applesauce.  Add probiotic-rich foods to help increase healthy bacteria in the GI tract, such as yogurt and fermented milk products.  Wash your hands well after each diarrhea episode.  Only take over-the-counter or prescription medicines as directed by your caregiver.  Take a warm bath to relieve any burning or pain from frequent diarrhea episodes. SEEK IMMEDIATE MEDICAL CARE IF:   You are unable to keep fluids down.  You have persistent vomiting.  You have blood in your stool, or your stools are black and tarry.  You do not urinate in 6-8 hours, or there is only a small amount of very dark urine.  You have abdominal pain that increases or localizes.  You have weakness, dizziness, confusion, or light-headedness.  You have a severe headache.  Your diarrhea gets worse or does not get better.  You have a fever or persistent symptoms for more than 2-3 days.  You have a fever and your symptoms suddenly get worse. MAKE SURE YOU:   Understand these instructions.  Will watch your condition.  Will get help right away if you are not doing well or get worse.   This information is not intended to replace advice given to you by your health care provider. Make sure you discuss any questions you have with your health care provider.   Document Released: 10/02/2002 Document Revised: 11/02/2014 Document Reviewed: 06/19/2012 Elsevier Interactive Patient Education 2016 Elsevier Inc.  Nausea and Vomiting Nausea means you feel sick to your stomach. Throwing up (vomiting) is a reflex where stomach contents come out of your mouth. HOME CARE   Take medicine as told by your doctor.  Do not force yourself to eat. However, you do need to drink fluids.  If you feel like eating, eat a normal diet as told by your doctor.  Eat rice, wheat, potatoes, bread, lean meats, yogurt, fruits, and vegetables.  Avoid high-fat foods.  Drink enough fluids to keep your pee (urine)  clear or pale yellow.  Ask your doctor how to replace body fluid losses (rehydrate). Signs of body fluid loss (dehydration) include:  Feeling very thirsty.  Dry lips and mouth.  Feeling dizzy.  Dark pee.  Peeing less than normal.  Feeling confused.  Fast breathing or heart rate. GET HELP RIGHT AWAY IF:   You have blood in your throw up.  You have black or bloody poop (stool).  You have a bad headache or stiff neck.  You feel confused.  You have bad belly (abdominal) pain.  You have chest pain or trouble breathing.  You do not pee at least once every 8 hours.  You have cold, clammy skin.  You keep throwing up after 24 to 48 hours.  You have a fever. MAKE SURE YOU:   Understand these instructions.  Will watch your condition.  Will get help right away if you are not doing well or get worse.   This information is not intended to replace advice given to you by your health care provider. Make sure you discuss any questions you have with your health care provider.   Document Released: 03/30/2008 Document Revised: 01/04/2012 Document  Reviewed: 03/13/2011 Elsevier Interactive Patient Education [employer] Elsevier Inc.

## 2016-01-27 NOTE — ED Provider Notes (Signed)
CSN: [id number]     Arrival date & time 01/27/16  1845 History   First MD Initiated Contact with Patient 01/27/16 2011     Chief Complaint  Patient presents with  . Abdominal Pain  . Emesis  . Diarrhea     (Consider location/radiation/quality/duration/timing/severity/associated sxs/prior Treatment) HPI 30 year old male who presents with abdominal pain, nausea, vomiting, and diarrhea. States he had a shot of alcohol, ribs, chips and a lot of food yesterday. This morning woke up with epigastric abdominal pain, with nausea, vomiting, and diarrhea. Took bepto bismol, but vomited this up. No sick contacts or travel. No recent antibiotics. No dysuria or frequency, no cough, chest pain, breathing difficulty, passing out or lightheadedness.   Past Medical History  Diagnosis Date  . Hypertension   . Asthma    History reviewed. No pertinent past surgical history. Family History  Problem Relation Age of Onset  . Hypertension Other   . Diabetes Other    Social History  Substance Use Topics  . Smoking status: Never Smoker   . Smokeless tobacco: None  . Alcohol Use: Yes     Comment: occa    Review of Systems 10/14 systems reviewed and are negative other than those stated in the HPI    Allergies  Review of patient's allergies indicates no known allergies.  Home Medications   Prior to Admission medications   Medication Sig Start Date End Date Taking? Authorizing Provider  bismuth subsalicylate (PEPTO BISMOL) 262 MG/15ML suspension Take 30 mLs by mouth every 6 (six) hours as needed for indigestion.   Yes Historical Provider, MD  Multiple Vitamins-Minerals (MULTIVITAMIN GUMMIES ADULT PO) Take 2 tablets by mouth daily.   Yes Historical Provider, MD  HYDROcodone-homatropine (HYCODAN) 5-1.5 MG/5ML syrup Take 5 mLs by mouth every 6 (six) hours as needed for cough. 10/06/15   Charm RingsErin J Honig, MD  loperamide (IMODIUM) 2 MG capsule Take 1 capsule (2 mg total) by mouth 4 (four) times daily as  needed for diarrhea or loose stools. 01/27/16   Lavera Guiseana Duo Tamsen Reist, MD  ondansetron (ZOFRAN) 4 MG tablet Take 1 tablet (4 mg total) by mouth every 6 (six) hours. 01/27/16   Lavera Guiseana Duo Morgen Ritacco, MD  predniSONE (DELTASONE) 50 MG tablet Take 1 pill daily for 5 days. 10/06/15   Charm RingsErin J Honig, MD   BP 139/84 mmHg  Pulse 88  Temp(Src) 99.2 F (37.3 C) (Oral)  Resp 16  SpO2 100% Physical Exam Physical Exam  Nursing note and vitals reviewed. Constitutional: Well developed, well nourished, non-toxic, and in no acute distress Head: Normocephalic and atraumatic.  Mouth/Throat: Oropharynx is clear and moist.  Neck: Normal range of motion. Neck supple.  Cardiovascular: Normal rate and regular rhythm.   Pulmonary/Chest: Effort normal and breath sounds normal.  Abdominal: Soft. There is minimal suprapubic tenderness. There is no rebound and no guarding. No tenderness at McBurney's point. Negative Murphy's signs. No CVA tenderness. Musculoskeletal: Normal range of motion.  Neurological: Alert, no facial droop, fluent speech, moves all extremities symmetrically Skin: Skin is warm and dry.  Psychiatric: Cooperative  ED Course  Procedures (including critical care time) Labs Review Labs Reviewed  COMPREHENSIVE METABOLIC PANEL - Abnormal; Notable for the following:    Glucose, Bld 113 (*)    Total Bilirubin 1.3 (*)    All other components within normal limits  CBC - Abnormal; Notable for the following:    WBC 12.4 (*)    MCV 77.7 (*)    MCHC 37.0 (*)  All other components within normal limits  URINALYSIS, ROUTINE W REFLEX MICROSCOPIC (NOT AT [facility]) - Abnormal; Notable for the following:    Hgb urine dipstick TRACE (*)    All other components within normal limits  URINE MICROSCOPIC-ADD ON - Abnormal; Notable for the following:    Squamous Epithelial / LPF 0-5 (*)    All other components within normal limits  DIFFERENTIAL - Abnormal; Notable for the following:    Neutro Abs 10.9 (*)    Lymphs Abs 0.6 (*)     All other components within normal limits  LIPASE, BLOOD    Imaging Review No results found. I have personally reviewed and evaluated these images and lab results as part of my medical decision-making.   EKG Interpretation None      MDM   Final diagnoses:  Nausea vomiting and diarrhea  Abdominal pain, suprapubic, unspecified laterality    30 year old male who presents with nausea, vomiting, and diarrhea for 1 day. He is nontoxic in no acute distress on presentation. Vital signs are within normal limits. He does not appear very dehydrated on exam. Has a soft and benign abdomen with some minimal suprapubic tenderness to palpation. No tenderness at McBurney's point, and at this time I do not have suspicion for an early appendicitis. Blood work is overall unremarkable aside from minimal leukocytosis of 12. UA is unremarkable for infection. He has been given antibiotics and tolerating by mouth intake without difficulty on reevaluation exam remains benign. I feel that presentation is overall consistent with that of a benign GI illness. He is given a course of Zofran and loperamide as needed. He is given warning signs for ED return for reevaluation as well as warning signs for potential appendicitis. Tolerating PO intake. Felt comfortable for discharge home. Strict return and follow-up instructions reviewed. He expressed understanding of all discharge instructions and felt comfortable with the plan of care.     Lavera Guise, MD 01/27/16 2225

## 2016-01-27 NOTE — ED Notes (Signed)
Pt called from triage, no answer 

## 2017-06-21 ENCOUNTER — Emergency Department (HOSPITAL_COMMUNITY)
Admission: EM | Admit: 2017-06-21 | Discharge: 2017-06-22 | Disposition: A | Discharge status: Home / Self Care | Payer: Self-pay | Attending: Emergency Medicine | Admitting: Emergency Medicine

## 2017-06-21 ENCOUNTER — Encounter (HOSPITAL_COMMUNITY): Payer: Self-pay

## 2017-06-21 ENCOUNTER — Emergency Department (HOSPITAL_COMMUNITY): Payer: Self-pay

## 2017-06-21 DIAGNOSIS — J45909 Unspecified asthma, uncomplicated: Secondary | ICD-10-CM | POA: Insufficient documentation

## 2017-06-21 DIAGNOSIS — I10 Essential (primary) hypertension: Secondary | ICD-10-CM | POA: Insufficient documentation

## 2017-06-21 DIAGNOSIS — R0789 Other chest pain: Secondary | ICD-10-CM | POA: Insufficient documentation

## 2017-06-21 DIAGNOSIS — Z79899 Other long term (current) drug therapy: Secondary | ICD-10-CM | POA: Insufficient documentation

## 2017-06-21 DIAGNOSIS — R0602 Shortness of breath: Secondary | ICD-10-CM | POA: Insufficient documentation

## 2017-06-21 NOTE — ED Triage Notes (Signed)
Patient states he has been having trouble getting his breath over the last few days-states he has been having chest pain relieved with rest over the last 2 days. Patient states no diaphoresis-just SOB and chest pain intermittently.

## 2017-06-22 LAB — BASIC METABOLIC PANEL
Anion gap: 8 (ref 5–15)
BUN: 19 mg/dL (ref 6–20)
CHLORIDE: 105 mmol/L (ref 101–111)
CO2: 26 mmol/L (ref 22–32)
CREATININE: 1.08 mg/dL (ref 0.61–1.24)
Calcium: 9 mg/dL (ref 8.9–10.3)
GFR calc non Af Amer: >60 mL/min (ref >60)
Glucose, Bld: 126 mg/dL — ABNORMAL HIGH (ref 65–99)
POTASSIUM: 3.8 mmol/L (ref 3.5–5.1)
Sodium: 139 mmol/L (ref 135–145)

## 2017-06-22 LAB — POCT I-STAT TROPONIN I: Troponin i, poc: 0 ng/mL (ref 0.00–0.08)

## 2017-06-22 LAB — CBC
HEMATOCRIT: 38.8 % — AB (ref 39.0–52.0)
Hemoglobin: 14.3 g/dL (ref 13.0–17.0)
MCH: 28.6 pg (ref 26.0–34.0)
MCHC: 36.9 g/dL — ABNORMAL HIGH (ref 30.0–36.0)
MCV: 77.6 fL — AB (ref 78.0–100.0)
PLATELETS: 228 10*3/uL (ref 150–400)
RBC: 5 MIL/uL (ref 4.22–5.81)
RDW: 14.1 % (ref 11.5–15.5)
WBC: 9.3 10*3/uL (ref 4.0–10.5)

## 2017-06-22 NOTE — ED Notes (Signed)
EKG given to EDP, Ward,MD., for review. 

## 2017-06-22 NOTE — ED Provider Notes (Signed)
TIME SEEN: 12:44 AM  CHIEF COMPLAINT: chest pain and shortness of breath  HPI: patient is a 31 year old male with history of asthma and hypertension who presents emergency department with complaints of chest pain shortness of breath. Patient states that he had sharp chest pain yesterday and working out actually made it feel better. He states tonight he felt short of breath like he was "breathing funny" and lightheaded after eating dinner. He states his symptoms have resolved in the emergency department. He denies any fevers, cough, wheezing. No lower extremity swelling or pain. No nausea or vomiting. No diaphoresis.  No history of PE, DVT, exogenous estrogen use, recent fractures, surgery, trauma, hospitalization or prolonged travel. No lower extremity swelling or pain. No calf tenderness.   ROS: See HPI Constitutional: no fever  Eyes: no drainage  ENT: no runny nose   Cardiovascular:   chest pain  Resp:  SOB  GI: no vomiting GU: no dysuria Integumentary: no rash  Allergy: no hives  Musculoskeletal: no leg swelling  Neurological: no slurred speech ROS otherwise negative  PAST MEDICAL HISTORY/PAST SURGICAL HISTORY:  Past Medical History:  Diagnosis Date  . Asthma   . Hypertension     MEDICATIONS:  Prior to Admission medications   Medication Sig Start Date End Date Taking? Authorizing Provider  bismuth subsalicylate (PEPTO BISMOL) 262 MG/15ML suspension Take 30 mLs by mouth every 6 (six) hours as needed for indigestion.    [provider]  HYDROcodone-homatropine (HYCODAN) 5-1.5 MG/5ML syrup Take 5 mLs by mouth every 6 (six) hours as needed for cough. 10/06/15   Charm Rings, MD  loperamide (IMODIUM) 2 MG capsule Take 1 capsule (2 mg total) by mouth 4 (four) times daily as needed for diarrhea or loose stools. 01/27/16   Lavera Guise, MD  Multiple Vitamins-Minerals (MULTIVITAMIN GUMMIES ADULT PO) Take 2 tablets by mouth daily.    [provider]  ondansetron (ZOFRAN)  4 MG tablet Take 1 tablet (4 mg total) by mouth every 6 (six) hours. 01/27/16   Lavera Guise, MD  predniSONE (DELTASONE) 50 MG tablet Take 1 pill daily for 5 days. 10/06/15   Charm Rings, MD    ALLERGIES:  No Known Allergies  SOCIAL HISTORY:  Social History  Substance Use Topics  . Smoking status: Never Smoker  . Smokeless tobacco: Never Used  . Alcohol use Yes     Comment: occa    FAMILY HISTORY: Family History  Problem Relation Age of Onset  . Hypertension Other   . Diabetes Other     EXAM: BP (!) 127/115   Pulse 65   Temp 98.5 F (36.9 C) (Oral)   Resp 18   SpO2 100%  CONSTITUTIONAL: Alert and oriented and responds appropriately to questions. Well-appearing; well-nourished HEAD: Normocephalic EYES: Conjunctivae clear, pupils appear equal, EOMI ENT: normal nose; moist mucous membranes NECK: Supple, no meningismus, no nuchal rigidity, no LAD  CARD: RRR; S1 and S2 appreciated; no murmurs, no clicks, no rubs, no gallops RESP: Normal chest excursion without splinting or tachypnea; breath sounds clear and equal bilaterally; no wheezes, no rhonchi, no rales, no hypoxia or respiratory distress, speaking full sentences ABD/GI: Normal bowel sounds; non-distended; soft, non-tender, no rebound, no guarding, no peritoneal signs, no hepatosplenomegaly BACK:  The back appears normal and is non-tender to palpation, there is no CVA tenderness EXT: Normal ROM in all joints; non-tender to palpation; no edema; normal capillary refill; no cyanosis, no calf tenderness or swelling    SKIN:  Normal color for age and race; warm; no rash NEURO: Moves all extremities equally PSYCH: The patient's mood and manner are appropriate. Grooming and personal hygiene are appropriate.  MEDICAL DECISION MAKING: patient here with very atypical chest pain. Doubt ACS or dissection. No infectious symptoms. He is PERC negative. Hemodynamically stable. Initially hypertensive but this has resolved without  intervention. Cardias, chest x-ray pending as ordered in triage. EKG shows no ischemic abnormality.  ED PROGRESS: patient's labs are unremarkable. Chest x-ray clear. He is still asymptomatic and hemodynamically stable. I feel he is safe for discharge home.  He States now that he feels like this was something that he ate tonight that caused his symptoms.  At this time, I do not feel there is any life-threatening condition present. I have reviewed and discussed all results (EKG, imaging, lab, urine as appropriate) and exam findings with patient/family. I have reviewed nursing notes and appropriate previous records.  I feel the patient is safe to be discharged home without further emergent workup and can continue workup as an outpatient as needed. Discussed usual and customary return precautions. Patient/family verbalize understanding and are comfortable with this plan.  Outpatient follow-up has been provided if needed. All questions have been answered.     EKG Interpretation  Date/Time:  Monday June 21 2017 23:49:01 EDT Ventricular Rate:  87 PR Interval:    QRS Duration: 91 QT Interval:  346 QTC Calculation: 417 R Axis:   112 Text Interpretation:  Sinus rhythm Anteroseptal infarct, age indeterminate Minimal ST elevation, inferior leads No significant change since last tracing Confirmed by Ward, Baxter Hire [id number]) on 06/22/2017 12:13:25 AM         Ward, Layla Maw, DO 06/22/17 6045

## 2017-06-22 NOTE — ED Notes (Signed)
Pt ambulatory and independent at discharge.  Verbalized understanding of discharge instructions 

## 2017-06-22 NOTE — Discharge Instructions (Signed)
To find a primary care or specialty doctor please call [phone] or [phone] to access "[facility] Find a Doctor Service." ° °You may also go on the [facility] website at [email or web address] ° °There are also multiple [facility], [facility] and [facility] practices throughout the [place] that are frequently accepting new patients. You may find a clinic that is close to your home and contact them. ° ° [facility] -  °[address] °[place] [address] °[phone] ° ° °[facility] -  °[address] °[place] [address] °[phone] ° ° °[facility] - °[address]  °[place] [address] °[phone] ° ° °

## 2017-06-23 ENCOUNTER — Encounter (HOSPITAL_COMMUNITY): Payer: Self-pay | Admitting: Family Medicine

## 2017-06-23 ENCOUNTER — Ambulatory Visit (HOSPITAL_COMMUNITY): Admission: EM | Admit: 2017-06-23 | Discharge: 2017-06-23 | Discharge status: Absent without leave | Payer: Self-pay

## 2017-06-23 ENCOUNTER — Ambulatory Visit (HOSPITAL_COMMUNITY)
Admission: EM | Admit: 2017-06-23 | Discharge: 2017-06-23 | Disposition: A | Discharge status: Home / Self Care | Payer: Self-pay | Attending: Family Medicine | Admitting: Family Medicine

## 2017-06-23 DIAGNOSIS — R030 Elevated blood-pressure reading, without diagnosis of hypertension: Secondary | ICD-10-CM

## 2017-06-23 NOTE — ED Triage Notes (Signed)
Pt here for headache and sts that he took his BP and it was elevated. No hx of hypertension.

## 2017-06-24 ENCOUNTER — Emergency Department (HOSPITAL_COMMUNITY)
Admission: EM | Admit: 2017-06-24 | Discharge: 2017-06-24 | Disposition: A | Discharge status: Home / Self Care | Payer: Self-pay | Attending: Emergency Medicine | Admitting: Emergency Medicine

## 2017-06-24 ENCOUNTER — Encounter (HOSPITAL_COMMUNITY): Payer: Self-pay | Admitting: Emergency Medicine

## 2017-06-24 ENCOUNTER — Emergency Department (HOSPITAL_COMMUNITY): Admission: EM | Admit: 2017-06-24 | Discharge: 2017-06-24 | Discharge status: Absent without leave | Payer: Self-pay

## 2017-06-24 DIAGNOSIS — Z5321 Procedure and treatment not carried out due to patient leaving prior to being seen by health care provider: Secondary | ICD-10-CM | POA: Insufficient documentation

## 2017-06-24 DIAGNOSIS — R42 Dizziness and giddiness: Secondary | ICD-10-CM | POA: Insufficient documentation

## 2017-06-24 NOTE — ED Notes (Signed)
Called Pt in lobby x2, no response, do not see Pt.

## 2017-06-24 NOTE — ED Provider Notes (Signed)
[facility]   [id number] 06/23/17 Arrival Time: 1900  ASSESSMENT & PLAN:  1. Elevated blood pressure reading without diagnosis of hypertension    Will continue to check BP at home. Number for [facility] given so that me may establish a PCP. Does not desire initiated of medical treatment for BP tonight. Prefers to wait and discuss with new PCP. Written information on HTN given. He may f/u here as needed.  Reviewed expectations re: course of current medical issues. Questions answered. Outlined signs and symptoms indicating need for more acute intervention. Patient verbalized understanding. After Visit Summary given.   SUBJECTIVE:  Jose Hall is a 31 y.o. male who reports being told his BP has been elevated recently. Check with cuff at home and has visited the ED. Possible FH of HTN. He reports occasional headaches and questions relation. No visual changes. No current CP or respiratory difficulties. Non-smoker. No drug use. Otherwise he feels well.  ROS: As per HPI.   OBJECTIVE:  Vitals:   06/23/17 1953  BP: (!) 153/94  Pulse: 77  Resp: 18  Temp: 98.6 F (37 C)  SpO2: 100%    General appearance: alert; no distress; anxious Eyes: PERRLA; EOMI; conjunctiva normal Neck: supple Lungs: clear to auscultation bilaterally Heart: regular rate and rhythm Abdomen: soft, non-tender; bowel sounds normal; no masses or organomegaly; no guarding or rebound tenderness Extremities: no cyanosis or edema; symmetrical with no gross deformities Skin: warm and dry Psychological: alert and cooperative; normal mood and affect  Labs: Results for orders placed or performed during the hospital encounter of 06/21/17  Basic metabolic panel  Result Value Ref Range   Sodium 139 135 - 145 mmol/L   Potassium 3.8 3.5 - 5.1 mmol/L   Chloride 105 101 - 111 mmol/L   CO2 26 22 - 32 mmol/L   Glucose, Bld 126 (H) 65 - 99 mg/dL   BUN 19 6 - 20 mg/dL   Creatinine, Ser  4.09 0.61 - 1.24 mg/dL   Calcium 9.0 8.9 - 81.1 mg/dL   GFR calc non Af Amer >60 >60 mL/min   GFR calc Af Amer >60 >60 mL/min   Anion gap 8 5 - 15  CBC  Result Value Ref Range   WBC 9.3 4.0 - 10.5 K/uL   RBC 5.00 4.22 - 5.81 MIL/uL   Hemoglobin 14.3 13.0 - 17.0 g/dL   HCT 91.4 (L) 78.2 - 95.6 %   MCV 77.6 (L) 78.0 - 100.0 fL   MCH 28.6 26.0 - 34.0 pg   MCHC 36.9 (H) 30.0 - 36.0 g/dL   RDW 21.3 08.6 - 57.8 %   Platelets 228 150 - 400 K/uL  POCT i-Stat troponin I  Result Value Ref Range   Troponin i, poc 0.00 0.00 - 0.08 ng/mL   Comment 3           No Known Allergies  Past Medical History:  Diagnosis Date  . Asthma   . Hypertension    Social History   Social History  . Marital status: Single    Spouse name: N/A  . Number of children: N/A  . Years of education: N/A   Occupational History  . Not on file.   Social History Main Topics  . Smoking status: Never Smoker  . Smokeless tobacco: Never Used  . Alcohol use Yes     Comment: occa  . Drug use: No  . Sexual activity: Yes   Other Topics Concern  .  Not on file   Social History Narrative  . No narrative on file   Family History  Problem Relation Age of Onset  . Hypertension Other   . Diabetes Other    History reviewed. No pertinent surgical history.   Mardella LaymanHagler, Priyansh Pry, MD 06/24/17 [phone]

## 2017-06-24 NOTE — ED Triage Notes (Signed)
Patient c/o dizziness over the past couple days. Reports seen here and urgent care and was told blood work was all normal. Patient wants to know why he keeps feeling bad.

## 2017-06-24 NOTE — ED Notes (Signed)
Called Pt in lobby to be roomed, no response. 

## 2017-06-27 ENCOUNTER — Encounter (HOSPITAL_COMMUNITY): Payer: Self-pay | Admitting: *Deleted

## 2017-06-27 ENCOUNTER — Emergency Department (HOSPITAL_COMMUNITY)
Admission: EM | Admit: 2017-06-27 | Discharge: 2017-06-27 | Disposition: A | Discharge status: Home / Self Care | Payer: Self-pay | Attending: Emergency Medicine | Admitting: Emergency Medicine

## 2017-06-27 DIAGNOSIS — Z5321 Procedure and treatment not carried out due to patient leaving prior to being seen by health care provider: Secondary | ICD-10-CM | POA: Insufficient documentation

## 2017-06-27 DIAGNOSIS — R0602 Shortness of breath: Secondary | ICD-10-CM | POA: Insufficient documentation

## 2017-06-27 NOTE — ED Notes (Signed)
Patient stated to staff that he was feeling better and he was leaving.  Patient was not showing any signs of distress on departure

## 2017-06-27 NOTE — ED Triage Notes (Signed)
Patient is alert and oriented x4.  He is being seen for shortness of breath.  Patient states that he feels better since arriving to the ED.  Patient states that he gets anxious worrying about his BP elevating.  Currently he denies any pain .

## 2017-07-15 ENCOUNTER — Emergency Department (HOSPITAL_COMMUNITY): Admission: EM | Admit: 2017-07-15 | Discharge: 2017-07-15 | Discharge status: Absent without leave | Payer: Self-pay

## 2017-07-15 NOTE — ED Notes (Signed)
No answer in lobby when called for triage 

## 2017-07-18 ENCOUNTER — Encounter (HOSPITAL_COMMUNITY): Payer: Self-pay | Admitting: *Deleted

## 2017-07-18 ENCOUNTER — Ambulatory Visit (HOSPITAL_COMMUNITY)
Admission: EM | Admit: 2017-07-18 | Discharge: 2017-07-18 | Disposition: A | Discharge status: Home / Self Care | Payer: Self-pay | Attending: Family Medicine | Admitting: Family Medicine

## 2017-07-18 DIAGNOSIS — R0602 Shortness of breath: Secondary | ICD-10-CM

## 2017-07-18 MED ORDER — TRAZODONE HCL 50 MG PO TABS: 50.0000 mg | ORAL_TABLET | Freq: Every day | ORAL | 1 refills | Status: DC

## 2017-07-18 MED ORDER — ALBUTEROL SULFATE HFA 108 (90 BASE) MCG/ACT IN AERS: 2.0000 | INHALATION_SPRAY | RESPIRATORY_TRACT | 1 refills | Status: DC | PRN

## 2017-07-18 MED ORDER — AEROCHAMBER PLUS FLO-VU LARGE MISC
Status: AC
  Filled 2017-07-18: qty 1

## 2017-07-18 MED ORDER — ALBUTEROL SULFATE HFA 108 (90 BASE) MCG/ACT IN AERS
2.0000 | INHALATION_SPRAY | Freq: Once | RESPIRATORY_TRACT | Status: AC
  Administered 2017-07-18: 2 via RESPIRATORY_TRACT

## 2017-07-18 MED ORDER — ALBUTEROL SULFATE HFA 108 (90 BASE) MCG/ACT IN AERS
INHALATION_SPRAY | RESPIRATORY_TRACT | Status: AC
  Filled 2017-07-18: qty 6.7

## 2017-07-18 NOTE — ED Provider Notes (Signed)
[facility]   [id number] 07/18/17 Arrival Time: 1905   SUBJECTIVE:  Jose Hall is a 31 y.o. male who presents to the urgent care with complaint of SOB x 2 wks - "like I've gotta take deep breaths".  Denies wheezing or cough.  Patient has a history of asthma and he thinks maybe that's the cause.  Patient is also dealing with some great stress. His brother was murdered 3 years ago and the alleged assailant is now being tried and he's had to go to the court house every day.     Past Medical History:  Diagnosis Date  . Asthma   . Hypertension    Family History  Problem Relation Age of Onset  . Hypertension Other   . Diabetes Other    Social History   Social History  . Marital status: Single    Spouse name: N/A  . Number of children: N/A  . Years of education: N/A   Occupational History  . Not on file.   Social History Main Topics  . Smoking status: Former Games developer  . Smokeless tobacco: Never Used  . Alcohol use No  . Drug use: No  . Sexual activity: Yes   Other Topics Concern  . Not on file   Social History Narrative  . No narrative on file   No outpatient prescriptions have been marked as taking for the 07/18/17 encounter [facility] Encounter).   No Known Allergies    ROS: As per HPI, remainder of ROS negative.   OBJECTIVE:   Vitals:   07/18/17 1915  BP: (!) 141/90  Pulse: 66  Resp: 14  Temp: 98.3 F (36.8 C)  TempSrc: Oral  SpO2: 100%     General appearance: alert; no distress Eyes: PERRL; EOMI; conjunctiva normal HENT: normocephalic; atraumatic; TMs normal, canal normal, external ears normal without trauma; nasal mucosa normal; oral mucosa normal Neck: supple Lungs: few exp wheezes Heart: regular rate and rhythm Abdomen: soft, non-tender; bowel sounds normal; no masses or organomegaly; no guarding or rebound tenderness Back: no CVA tenderness Extremities: no cyanosis or edema; symmetrical with no gross deformities Skin: warm  and dry Neurologic: normal gait; grossly normal Psychological: alert and cooperative; normal mood and affect      Labs:  Results for orders placed or performed during the hospital encounter of 06/21/17  Basic metabolic panel  Result Value Ref Range   Sodium 139 135 - 145 mmol/L   Potassium 3.8 3.5 - 5.1 mmol/L   Chloride 105 101 - 111 mmol/L   CO2 26 22 - 32 mmol/L   Glucose, Bld 126 (H) 65 - 99 mg/dL   BUN 19 6 - 20 mg/dL   Creatinine, Ser 4.09 0.61 - 1.24 mg/dL   Calcium 9.0 8.9 - 81.1 mg/dL   GFR calc non Af Amer >60 >60 mL/min   GFR calc Af Amer >60 >60 mL/min   Anion gap 8 5 - 15  CBC  Result Value Ref Range   WBC 9.3 4.0 - 10.5 K/uL   RBC 5.00 4.22 - 5.81 MIL/uL   Hemoglobin 14.3 13.0 - 17.0 g/dL   HCT 91.4 (L) 78.2 - 95.6 %   MCV 77.6 (L) 78.0 - 100.0 fL   MCH 28.6 26.0 - 34.0 pg   MCHC 36.9 (H) 30.0 - 36.0 g/dL   RDW 21.3 08.6 - 57.8 %   Platelets 228 150 - 400 K/uL  POCT i-Stat troponin I  Result Value Ref Range   Troponin i,  poc 0.00 0.00 - 0.08 ng/mL   Comment 3            Labs Reviewed - No data to display  No results found.     ASSESSMENT & PLAN:  1. Shortness of breath     Meds ordered this encounter  Medications  . albuterol (PROVENTIL HFA;VENTOLIN HFA) 108 (90 Base) MCG/ACT inhaler 2 puff  . traZODone (DESYREL) 50 MG tablet    Sig: Take 1 tablet (50 mg total) by mouth at bedtime.    Dispense:  30 tablet    Refill:  1  . albuterol (PROVENTIL HFA;VENTOLIN HFA) 108 (90 Base) MCG/ACT inhaler    Sig: Inhale 2 puffs into the lungs every 4 (four) hours as needed for wheezing or shortness of breath (cough, shortness of breath or wheezing.).    Dispense:  1 Inhaler    Refill:  1    Reviewed expectations re: course of current medical issues. Questions answered. Outlined signs and symptoms indicating need for more acute intervention. Patient verbalized understanding. After Visit Summary given.      Elvina Sidle, MD 07/18/17 2019

## 2017-07-18 NOTE — ED Triage Notes (Signed)
C/O SOB x 2 wks - "like I've gotta take deep breaths".  Denies wheezing or cough.

## 2017-08-23 ENCOUNTER — Emergency Department (HOSPITAL_COMMUNITY)
Admission: EM | Admit: 2017-08-23 | Discharge: 2017-08-23 | Disposition: A | Discharge status: Home / Self Care | Payer: Self-pay | Attending: Emergency Medicine | Admitting: Emergency Medicine

## 2017-08-23 ENCOUNTER — Encounter (HOSPITAL_COMMUNITY): Payer: Self-pay | Admitting: *Deleted

## 2017-08-23 DIAGNOSIS — I10 Essential (primary) hypertension: Secondary | ICD-10-CM | POA: Insufficient documentation

## 2017-08-23 DIAGNOSIS — Z87891 Personal history of nicotine dependence: Secondary | ICD-10-CM | POA: Insufficient documentation

## 2017-08-23 DIAGNOSIS — J4520 Mild intermittent asthma, uncomplicated: Secondary | ICD-10-CM | POA: Insufficient documentation

## 2017-08-23 DIAGNOSIS — Z79899 Other long term (current) drug therapy: Secondary | ICD-10-CM | POA: Insufficient documentation

## 2017-08-23 MED ORDER — ALBUTEROL SULFATE (2.5 MG/3ML) 0.083% IN NEBU
5.0000 mg | INHALATION_SOLUTION | Freq: Once | RESPIRATORY_TRACT | Status: AC
  Administered 2017-08-23: 5 mg via RESPIRATORY_TRACT
  Filled 2017-08-23: qty 6

## 2017-08-23 MED ORDER — ALBUTEROL SULFATE HFA 108 (90 BASE) MCG/ACT IN AERS: 1.0000 | INHALATION_SPRAY | Freq: Four times a day (QID) | RESPIRATORY_TRACT | 0 refills | Status: DC | PRN

## 2017-08-23 MED ORDER — PREDNISONE 20 MG PO TABS: 40.0000 mg | ORAL_TABLET | Freq: Every day | ORAL | 0 refills | Status: DC

## 2017-08-23 NOTE — ED Notes (Signed)
Bed: WTR5 Expected date:  Expected time:  Means of arrival:  Comments: 

## 2017-08-23 NOTE — ED Notes (Signed)
Pt is alert and oriented x 4 and is verbally responsive. Pt denies pain at this time. Pt s/p Breathing treatment and reports improvement. Pt RR is WNL  No labored breathing. L/s clear = bilateral.

## 2017-08-23 NOTE — ED Provider Notes (Signed)
[facility] Provider Note   CSN: [id number] Arrival date & time: 08/23/17  0941     History   Chief Complaint Chief Complaint  Patient presents with  . Shortness of Breath    HPI Jose Hall is a 31 y.o. male.  Pt comes in with c/o sob that has been going on for over a month. He was seen at that time. He has been using an inhaler every other day. No previous history of asthma until recently. He is living in a place with mold. He used the inhaler this morning and it didn't work so he decided to come here and be seen. He states that treatment here relieved the symptoms      Past Medical History:  Diagnosis Date  . Asthma   . Hypertension     Patient Active Problem List   Diagnosis Date Noted  . ASTHMA, UNSPECIFIED 12/23/2006    History reviewed. No pertinent surgical history.     Home Medications    Prior to Admission medications   Medication Sig Start Date End Date Taking? Authorizing Provider  albuterol (PROVENTIL HFA;VENTOLIN HFA) 108 (90 Base) MCG/ACT inhaler Inhale 1-2 puffs into the lungs every 6 (six) hours as needed for wheezing or shortness of breath. 08/23/17   Teressa Lower, NP  predniSONE (DELTASONE) 20 MG tablet Take 2 tablets (40 mg total) by mouth daily. 08/23/17   Teressa Lower, NP  traZODone (DESYREL) 50 MG tablet Take 1 tablet (50 mg total) by mouth at bedtime. 07/18/17   Elvina Sidle, MD    Family History Family History  Problem Relation Age of Onset  . Hypertension Other   . Diabetes Other     Social History Social History  Substance Use Topics  . Smoking status: Former Games developer  . Smokeless tobacco: Never Used  . Alcohol use No     Allergies   Patient has no known allergies.   Review of Systems Review of Systems  All other systems reviewed and are negative.    Physical Exam Updated Vital Signs BP (!) 124/91 (BP Location: Left Arm)   Pulse 88   Temp 98.2 F (36.8 C) (Oral)    Resp 20   Ht 5\' 7"  (1.702 m)   Wt 73.9 kg (163 lb)   SpO2 100%   BMI 25.53 kg/m   Physical Exam  Constitutional: He is oriented to person, place, and time. He appears well-developed and well-nourished.  HENT:  Head: Normocephalic and atraumatic.  Right Ear: External ear normal.  Left Ear: External ear normal.  Eyes: Pupils are equal, round, and reactive to light. EOM are normal.  Neck: Normal range of motion. Neck supple.  Cardiovascular: Normal rate.   Pulmonary/Chest: Effort normal.  Abdominal: Soft.  Musculoskeletal: Normal range of motion.  Neurological: He is oriented to person, place, and time.  Skin: Skin is dry.  Nursing note and vitals reviewed.    ED Treatments / Results  Labs (all labs ordered are listed, but only abnormal results are displayed) Labs Reviewed - No data to display  EKG  EKG Interpretation None       Radiology No results found.  Procedures Procedures (including critical care time)  Medications Ordered in ED Medications  albuterol (PROVENTIL) (2.5 MG/3ML) 0.083% nebulizer solution 5 mg (5 mg Nebulization Given 08/23/17 1007)     Initial Impression / Assessment and Plan / ED Course  I have reviewed the triage vital signs and the nursing notes.  Pertinent  labs & imaging results that were available during my care of the patient were reviewed by me and considered in my medical decision making (see chart for details).     Pt given steroids and albuterol for treatment. Discussed follow up and return precautions with pt. Don't think imaging is needed at this time  Final Clinical Impressions(s) / ED Diagnoses   Final diagnoses:  Mild intermittent asthma without complication    New Prescriptions Discharge Medication List as of 08/23/2017 11:45 AM    START taking these medications   Details  predniSONE (DELTASONE) 20 MG tablet Take 2 tablets (40 mg total) by mouth daily., Starting Mon 08/23/2017, Print         Rubin PayorPickering,  Deliah BostonVrinda, NP 08/23/17 1254    Shaune PollackIsaacs, Cameron, MD 08/24/17 1151

## 2017-08-23 NOTE — ED Triage Notes (Signed)
Pt with hx of asthma complains of shortness of breath for the past month, unrelieved by his inhaler.

## 2017-08-31 DIAGNOSIS — J45909 Unspecified asthma, uncomplicated: Secondary | ICD-10-CM | POA: Insufficient documentation

## 2017-08-31 DIAGNOSIS — Z87891 Personal history of nicotine dependence: Secondary | ICD-10-CM | POA: Insufficient documentation

## 2017-08-31 DIAGNOSIS — R002 Palpitations: Secondary | ICD-10-CM | POA: Insufficient documentation

## 2017-08-31 DIAGNOSIS — F419 Anxiety disorder, unspecified: Secondary | ICD-10-CM | POA: Insufficient documentation

## 2017-09-01 ENCOUNTER — Emergency Department (HOSPITAL_COMMUNITY)
Admission: EM | Admit: 2017-09-01 | Discharge: 2017-09-01 | Disposition: A | Discharge status: Home / Self Care | Payer: Self-pay | Attending: Emergency Medicine | Admitting: Emergency Medicine

## 2017-09-01 ENCOUNTER — Emergency Department (HOSPITAL_COMMUNITY): Payer: Self-pay

## 2017-09-01 DIAGNOSIS — F419 Anxiety disorder, unspecified: Secondary | ICD-10-CM

## 2017-09-01 DIAGNOSIS — R002 Palpitations: Secondary | ICD-10-CM

## 2017-09-01 LAB — BASIC METABOLIC PANEL
ANION GAP: 9 (ref 5–15)
BUN: 19 mg/dL (ref 6–20)
CALCIUM: 9 mg/dL (ref 8.9–10.3)
CO2: 25 mmol/L (ref 22–32)
CREATININE: 1 mg/dL (ref 0.61–1.24)
Chloride: 103 mmol/L (ref 101–111)
Glucose, Bld: 110 mg/dL — ABNORMAL HIGH (ref 65–99)
Potassium: 4.2 mmol/L (ref 3.5–5.1)
SODIUM: 137 mmol/L (ref 135–145)

## 2017-09-01 LAB — CBC
HCT: 40.6 % (ref 39.0–52.0)
HEMOGLOBIN: 14.9 g/dL (ref 13.0–17.0)
MCH: 28.7 pg (ref 26.0–34.0)
MCHC: 36.7 g/dL — ABNORMAL HIGH (ref 30.0–36.0)
MCV: 78.2 fL (ref 78.0–100.0)
PLATELETS: 246 10*3/uL (ref 150–400)
RBC: 5.19 MIL/uL (ref 4.22–5.81)
RDW: 13.8 % (ref 11.5–15.5)
WBC: 11.3 10*3/uL — ABNORMAL HIGH (ref 4.0–10.5)

## 2017-09-01 LAB — MAGNESIUM: MAGNESIUM: 2.1 mg/dL (ref 1.7–2.4)

## 2017-09-01 NOTE — Discharge Instructions (Signed)
All the results in the ER are normal, labs and imaging. We are not sure what is causing your symptoms. The workup in the ER is not complete, and is limited to screening for life threatening and emergent conditions only, so please see a a primary doctor as soon as possible.

## 2017-09-01 NOTE — ED Triage Notes (Signed)
Pt states that earlier his heart felt like it was racing Pt has hx of panic attacks and did have medication for them Pt states he doesn't feel that way now

## 2017-09-01 NOTE — ED Provider Notes (Signed)
[facility]   CSN: [id number] Arrival date & time: 08/31/17  2333     History   Chief Complaint Chief Complaint  Patient presents with  . Panic Attack    HPI Jose Hall is a 31 y.o. male.  HPI Patient comes in with chief complaint of palpitation.  Patient reports that over the past several weeks he has had intermittent episodes of palpitations.  Earlier today he was at the gym and started having palpitations with associated shortness of breath.  Patient reports that he is also noted that his exercise capacity has gone down over the past few days.  Patient had some chest discomfort with the palpitations.  All of his symptoms have now resolved.  Patient denies any history of substance abuse, heavy smoking.  Patient has no premature CAD, dysrhythmia, sudden unexpected death in the family and he also denies any AICD or pacemakers within the immediate family.  Patient denies any heavy caffeine intake or energy drink use.  Patient cannot think of any specific evoking factors for his palpitations.  Patient also thinks that when he gets the palpitations he starts getting panicked, and in the past he was prescribed trazodone for anxiety.  Past Medical History:  Diagnosis Date  . Asthma   . Hypertension     Patient Active Problem List   Diagnosis Date Noted  . ASTHMA, UNSPECIFIED 12/23/2006    No past surgical history on file.     Home Medications    Prior to Admission medications   Medication Sig Start Date End Date Taking? Authorizing Provider  albuterol (PROVENTIL HFA;VENTOLIN HFA) 108 (90 Base) MCG/ACT inhaler Inhale 1-2 puffs into the lungs every 6 (six) hours as needed for wheezing or shortness of breath. 08/23/17   Teressa LowerPickering, Vrinda, NP  predniSONE (DELTASONE) 20 MG tablet Take 2 tablets (40 mg total) by mouth daily. 08/23/17   Teressa LowerPickering, Vrinda, NP  traZODone (DESYREL) 50 MG tablet Take 1 tablet (50 mg total) by mouth  at bedtime. 07/18/17   Elvina SidleLauenstein, Kurt, MD    Family History Family History  Problem Relation Age of Onset  . Hypertension Other   . Diabetes Other     Social History Social History   Tobacco Use  . Smoking status: Former Games developermoker  . Smokeless tobacco: Never Used  Substance Use Topics  . Alcohol use: No  . Drug use: No     Allergies   Patient has no known allergies.   Review of Systems Review of Systems  Constitutional: Positive for activity change.  Respiratory: Positive for shortness of breath.   Cardiovascular: Positive for palpitations.  Allergic/Immunologic: Negative for immunocompromised state.  Hematological: Does not bruise/bleed easily.     Physical Exam Updated Vital Signs BP 128/89 (BP Location: Right Arm)   Pulse 70   Temp 98.8 F (37.1 C) (Oral)   Resp 14   Ht 5\' 7"  (1.702 m)   Wt 73.5 kg (162 lb)   SpO2 98%   BMI 25.37 kg/m   Physical Exam  Constitutional: He is oriented to person, place, and time. He appears well-developed.  HENT:  Head: Atraumatic.  Neck: Neck supple.  Cardiovascular: Normal rate, regular rhythm and intact distal pulses.  Pulmonary/Chest: Effort normal. No stridor. No respiratory distress. He has no wheezes.  Abdominal: Soft.  Neurological: He is alert and oriented to person, place, and time.  Skin: Skin is warm.  Nursing note and vitals reviewed.    ED Treatments /  Results  Labs (all labs ordered are listed, but only abnormal results are displayed) Labs Reviewed  BASIC METABOLIC PANEL - Abnormal; Notable for the following components:      Result Value   Glucose, Bld 110 (*)    All other components within normal limits  CBC - Abnormal; Notable for the following components:   WBC 11.3 (*)    MCHC 36.7 (*)    All other components within normal limits  MAGNESIUM    EKG  EKG Interpretation  Date/Time:  Wednesday September 01 2017 03:01:22 EST Ventricular Rate:  70 PR Interval:    QRS Duration: 84 QT  Interval:  372 QTC Calculation: 402 R Axis:   105 Text Interpretation:  Sinus rhythm Right axis deviation Probable anteroseptal infarct, old No acute changes No significant change since last tracing Confirmed by Derwood KaplanNanavati, Deronda Christian [phone]([id number]) on 09/01/2017 3:37:07 AM       Radiology Dg Chest 2 View  Result Date: 09/01/2017 CLINICAL DATA:  Shortness of breath EXAM: CHEST  2 VIEW COMPARISON:  Chest radiograph 06/21/2017 FINDINGS: The heart size and mediastinal contours are within normal limits. Both lungs are clear. The visualized skeletal structures are unremarkable. IMPRESSION: No active cardiopulmonary disease. Electronically Signed   By: Deatra RobinsonKevin  Herman M.D.   On: 09/01/2017 03:19    Procedures Procedures (including critical care time)  Medications Ordered in ED Medications - No data to display   Initial Impression / Assessment and Plan / ED Course  I have reviewed the triage vital signs and the nursing notes.  Pertinent labs & imaging results that were available during my care of the patient were reviewed by me and considered in my medical decision making (see chart for details).     Patient comes in with chief complaint of palpitations.  EKG in the ER does not show any acute changes.  Patient is asymptomatic in the ER.  It appears that patient's episodes are intermittent and have no evoking factors.  Detailed medical history does not reveal any red flags on the social history, family history.  We will monitor patient on telemetry in the ER for extended period of time, however he has been advised to see primary care physician for further evaluation.    Patient does not have PCP, therefore we have given him cardiology follow-up for Holter monitoring.  However patient made aware that his symptoms could be non-cardiogenic, in which case PCP are the best diagnosing and management.  Final Clinical Impressions(s) / ED Diagnoses   Final diagnoses:  Anxiety  Palpitations    ED Discharge  Orders    None       Derwood KaplanNanavati, Ceriah Kohler, MD 09/02/17 [phone]

## 2017-09-09 ENCOUNTER — Encounter: Payer: Self-pay | Admitting: Cardiology

## 2017-09-09 ENCOUNTER — Ambulatory Visit (INDEPENDENT_AMBULATORY_CARE_PROVIDER_SITE_OTHER): Payer: Self-pay | Admitting: Cardiology

## 2017-09-09 VITALS — BP 150/98 | HR 71 | Ht 67.0 in | Wt 158.4 lb

## 2017-09-09 DIAGNOSIS — R002 Palpitations: Secondary | ICD-10-CM

## 2017-09-09 DIAGNOSIS — R0600 Dyspnea, unspecified: Secondary | ICD-10-CM

## 2017-09-09 DIAGNOSIS — R0602 Shortness of breath: Secondary | ICD-10-CM | POA: Insufficient documentation

## 2017-09-09 DIAGNOSIS — Z77120 Contact with and (suspected) exposure to mold (toxic): Secondary | ICD-10-CM

## 2017-09-09 DIAGNOSIS — Z87898 Personal history of other specified conditions: Secondary | ICD-10-CM

## 2017-09-09 DIAGNOSIS — R0609 Other forms of dyspnea: Secondary | ICD-10-CM

## 2017-09-09 DIAGNOSIS — R4589 Other symptoms and signs involving emotional state: Secondary | ICD-10-CM | POA: Insufficient documentation

## 2017-09-09 DIAGNOSIS — F418 Other specified anxiety disorders: Secondary | ICD-10-CM

## 2017-09-09 DIAGNOSIS — Z8709 Personal history of other diseases of the respiratory system: Secondary | ICD-10-CM

## 2017-09-09 NOTE — Assessment & Plan Note (Signed)
Pt has new exercised induced dyspnea

## 2017-09-09 NOTE — Progress Notes (Signed)
09/09/2017 Margit HanksLamar Q Finkel   1986/07/13  [id number]  Primary Physician Patient, No Pcp Per Primary Cardiologist: Dr Duke Salviaandolph (new)  HPI:  31 y/o AA male referred by the ED for palpitations and DOE. The pt has a history of asthma as a child but grew out of it. He has been living in a house for the past year that apparently may have some mold problems. The pt says it's always damp and smells. He said he went away with his girlfriend for awhile and when he came back his clothes were moldy. He has been in the ED a few times in the past 6 months for palpitations and DOE. He was placed on an inhaler for "asthma" a couple of months ago. He was just seen in the ED 09/01/17 with palpitations. His labs and CXR were normal. His EKG showed NSR, incomplete RBBB, Q in V2.   The pt tells me he has intermittent tachycardia, "usually at night". He tells me he'll wake up suddenly and his HR is racing. It may also occur after exercise. An example he gave me was sustained rapid HR after doing push ups.   This has so far been attributed to stress and panic attacks. The pt admits he has been under stress with a traumatic breakup with his girlfriend and the death of his brother. He denies any OTC medications, excessive caffeine intake, or recreational drug use. He tries to exercise regularly but he has noticed increased DOE with exercise in the past few months.    Current Outpatient Medications  Medication Sig Dispense Refill  . albuterol (PROVENTIL HFA;VENTOLIN HFA) 108 (90 Base) MCG/ACT inhaler Inhale 1-2 puffs into the lungs every 6 (six) hours as needed for wheezing or shortness of breath. 1 Inhaler 0   No current facility-administered medications for this visit.     No Known Allergies  Past Medical History:  Diagnosis Date  . Asthma   . Hypertension     Social History   Socioeconomic History  . Marital status: Single    Spouse name: Not on file  . Number of children: Not on file  . Years of  education: Not on file  . Highest education level: Not on file  Social Needs  . Financial resource strain: Not on file  . Food insecurity - worry: Not on file  . Food insecurity - inability: Not on file  . Transportation needs - medical: Not on file  . Transportation needs - non-medical: Not on file  Occupational History  . Not on file  Tobacco Use  . Smoking status: Former Games developermoker  . Smokeless tobacco: Never Used  Substance and Sexual Activity  . Alcohol use: No  . Drug use: No  . Sexual activity: Yes  Other Topics Concern  . Not on file  Social History Narrative  . Not on file     Family History  Problem Relation Age of Onset  . Hypertension Other   . Diabetes Other   . Asthma Father      Review of Systems: General: negative for chills, fever, night sweats or weight changes.  Cardiovascular: negative for chest pain, dyspnea on exertion, edema, orthopnea, palpitations, paroxysmal nocturnal dyspnea or shortness of breath Dermatological: negative for rash Respiratory: negative for cough or wheezing Urologic: negative for hematuria Abdominal: negative for nausea, vomiting, diarrhea, bright red blood per rectum, melena, or hematemesis Neurologic: negative for visual changes, syncope, or dizziness All other systems reviewed and are otherwise negative except as  noted above.    Blood pressure (!) 150/98, pulse 71, height 5\' 7"  (1.702 m), weight 158 lb 6.4 oz (71.8 kg).  General appearance: alert, cooperative and no distress Neck: no carotid bruit, no JVD and thyroid not enlarged, symmetric, no tenderness/mass/nodules Lungs: clear to auscultation bilaterally Heart: regular rate and rhythm Abdomen: soft, non-tender; bowel sounds normal; no masses,  no organomegaly Extremities: extremities normal, atraumatic, no cyanosis or edema Pulses: 2+ and symmetric Skin: Skin color, texture, turgor normal. No rashes or lesions Neurologic: Grossly normal  EKG ED 09/01/17- NSR,  incomplete RBBB, Q V2  ASSESSMENT AND PLAN:   History of palpitations Pt has a history of palpitations- r/o arrhythmia   Dyspnea on exertion Pt has new exercised induced dyspnea  History of asthma Asthma as a child, recently placed back on inhalers   Anxiety about health The pt is anxious about his health, he asked several times if I thought he was going to be OK  Suspected exposure to mold Not clear what role this is playing. Consider pulmonary work up if his cardiac work up is unremarkable.    PLAN  I suggested we obtain an Event Monitor and an echo. F/U with Dr Duke Salviaandolph after the above.   Corine ShelterLuke Madhuri Vacca PA-C 09/09/2017 10:42 AM

## 2017-09-09 NOTE — Assessment & Plan Note (Signed)
Pt has a history of palpitations- r/o arrhythmia

## 2017-09-09 NOTE — Assessment & Plan Note (Signed)
Asthma as a child, recently placed back on inhalers

## 2017-09-09 NOTE — Assessment & Plan Note (Signed)
Not clear what role this is playing. Consider pulmonary work up if his cardiac work up is unremarkable.

## 2017-09-09 NOTE — Patient Instructions (Signed)
Medication Instructions:  Continue current medications  If you need a refill on your cardiac medications before your next appointment, please call your pharmacy.  Labwork: None Ordered   Testing/Procedures: Your physician has requested that you have an echocardiogram. Echocardiography is a painless test that uses sound waves to create images of your heart. It provides your doctor with information about the size and shape of your heart and how well your heart's chambers and valves are working. This procedure takes approximately one hour. There are no restrictions for this procedure. Your physician has recommended that you wear an event monitor. Event monitors are medical devices that record the heart's electrical activity. Doctors most often us these monitors to diagnose arrhKoreaythmias. Arrhythmias are problems with the speed or rhythm of the heartbeat. The monitor is a small, portable device. You can wear one while you do your normal daily activities. This is usually used to diagnose what is causing palpitations/syncope (passing out).   Follow-Up: Your physician wants you to follow-up in: 6 weeks with Dr Duke Salviaandolph.    Thank you for choosing [facility]!!

## 2017-09-09 NOTE — Assessment & Plan Note (Signed)
The pt is anxious about his health, he asked several times if I thought he was going to be OK

## 2017-09-10 ENCOUNTER — Encounter (HOSPITAL_COMMUNITY): Payer: Self-pay | Admitting: Nurse Practitioner

## 2017-09-10 ENCOUNTER — Emergency Department (HOSPITAL_COMMUNITY)
Admission: EM | Admit: 2017-09-10 | Discharge: 2017-09-10 | Disposition: A | Discharge status: Home / Self Care | Payer: Self-pay | Attending: Emergency Medicine | Admitting: Emergency Medicine

## 2017-09-10 DIAGNOSIS — J45909 Unspecified asthma, uncomplicated: Secondary | ICD-10-CM | POA: Insufficient documentation

## 2017-09-10 DIAGNOSIS — I10 Essential (primary) hypertension: Secondary | ICD-10-CM | POA: Insufficient documentation

## 2017-09-10 DIAGNOSIS — F419 Anxiety disorder, unspecified: Secondary | ICD-10-CM | POA: Insufficient documentation

## 2017-09-10 DIAGNOSIS — Z87891 Personal history of nicotine dependence: Secondary | ICD-10-CM | POA: Insufficient documentation

## 2017-09-10 NOTE — ED Triage Notes (Signed)
Pt states he is having another panic attack. He is concerned that his heart is racing although he states he now feels a little better.

## 2017-09-10 NOTE — BH Assessment (Addendum)
Assessment Note  Jose Hall is an 31 y.o. male, who [facility]. Pt reported, a month ago, he went to Urgent Care because he was having difficult breathing. Pt reported, while at Urgent Care he was given antibiotics and an inhaler. While living in a house with mold he his asthma (wheezing, shortness of breath) returned. Pt reported, he was driving and began having a panic attack (heart palpitations and numbness in his hands/feet). Pt reported, two weeks prior he came to the ER for a similar presentation. Pt denies, SI, HI, AVH, and self-injurious behaviors.  Pt denies, abuse and substance use. Pt's UDS is pending. Pt denied, being linked to OPT resources (medication management and/or counseling.) Pt denies, previous inpatient admissions.  Pt presents alert with logical/coherent speech. Pt's eye contact is good. Pt's mood was anxious. Pt's affect is appropriate to circumstance. Pt's judgement is unimpaired. Pt's concentration was normal. Pt's insight and impulse control are good. Pt is oriented x4 (day, year, city and state.) Pt reported, [facility] he could contract for safety. Pt reported, if inpatient treatment is recommended he would not sign-in voluntarily.   Diagnosis: F41.1 Generalized Anxiety Disorder.  Past Medical History:  Past Medical History:  Diagnosis Date  . Asthma   . Hypertension     History reviewed. No pertinent surgical history.  Family History:  Family History  Problem Relation Age of Onset  . Hypertension Other   . Diabetes Other   . Asthma Father     Social History:  reports that he has quit smoking. he has never used smokeless tobacco. He reports that he does not drink alcohol or use drugs.  Additional Social History:  Alcohol / Drug Use Pain Medications: See MAR Prescriptions: See MAR Over the Counter: See MAR History of alcohol / drug use?: No history of alcohol / drug abuse(Pt denies. UDS is pending.  )  CIWA: CIWA-Ar BP: (!) 145/97 Pulse Rate: 85 COWS:    Allergies: No Known Allergies  Home Medications:  (Not in a hospital admission)  OB/GYN Status:  No LMP for male patient.  General Assessment Data Location of Assessment: [facility] ED TTS Assessment: In system Is this a Tele or Face-to-Face Assessment?: Face-to-Face Is this an Initial Assessment or a Re-assessment for this encounter?: Initial Assessment Marital status: Single Is patient pregnant?: No Pregnancy Status: No Living Arrangements: Alone Can pt return to current living arrangement?: Yes Admission Status: Voluntary Is patient capable of signing voluntary admission?: Yes Referral Source: Self/Family/Friend Insurance type: Self-pay     Crisis Care Plan Living Arrangements: Alone Legal Guardian: Other:(Self) Name of Psychiatrist: NA Name of Therapist: NA  Education Status Is patient currently in school?: No Current Grade: NA Highest grade of school patient has completed: 12th grade.  Name of school: NA Contact person: NA  Risk to self with the past 6 months Suicidal Ideation: No(Pt denies. ) Has patient been a risk to self within the past 6 months prior to admission? : No Suicidal Intent: No Has patient had any suicidal intent within the past 6 months prior to admission? : No Is patient at risk for suicide?: No Suicidal Plan?: No Has patient had any suicidal plan within the past 6 months prior to admission? : No Access to Means: No What has been your use of drugs/alcohol within the last 12 months?: Pt denies.  Previous Attempts/Gestures: No How many times?: 0 Other Self Harm Risks: Pt denies. Triggers for Past Attempts: None known  Intentional Self Injurious Behavior: None Family Suicide History: No Recent stressful life event(s): Loss (Comment)(Brother and cousin were murderer three years ago. ) Persecutory voices/beliefs?: No Depression: No Depression Symptoms: (Pt denies. ) Substance abuse history  and/or treatment for substance abuse?: No Suicide prevention information given to non-admitted patients: Not applicable  Risk to Others within the past 6 months Homicidal Ideation: No(Pt denies.) Does patient have any lifetime risk of violence toward others beyond the six months prior to admission? : No Thoughts of Harm to Others: No Current Homicidal Intent: No Current Homicidal Plan: No Access to Homicidal Means: No Identified Victim: NA History of harm to others?: No Assessment of Violence: None Noted Violent Behavior Description: NA Does patient have access to weapons?: Yes (Comment)(Pt has a registered gun. ) Criminal Charges Pending?: No Does patient have a court date: No Is patient on probation?: No  Psychosis Hallucinations: None noted Delusions: None noted  Mental Status Report Appearance/Hygiene: Unremarkable Eye Contact: Good Motor Activity: Unremarkable Speech: Logical/coherent Level of Consciousness: Alert Mood: Anxious Affect: Appropriate to circumstance Anxiety Level: Panic Attacks Panic attack frequency: Pt reported, having at least two panic attacks over the past two weeks.  Most recent panic attack: Pt reported, having at least two panic attacks over the past two weeks.  Thought Processes: Relevant, Coherent Judgement: Unimpaired Orientation: Other (Comment)(day year, city and state.) Obsessive Compulsive Thoughts/Behaviors: None  Cognitive Functioning Concentration: Normal Memory: Recent Intact IQ: Average Insight: Good Impulse Control: Good Appetite: Fair Sleep: No Change Total Hours of Sleep: 8 Vegetative Symptoms: None  ADLScreening [facility] Assessment Services) Patient's cognitive ability adequate to safely complete daily activities?: Yes Patient able to express need for assistance with ADLs?: Yes Independently performs ADLs?: Yes (appropriate for developmental age)  Prior Inpatient Therapy Prior Inpatient Therapy: No Prior Therapy Dates:  NA Prior Therapy Facilty/Provider(s): NA Reason for Treatment: NA  Prior Outpatient Therapy Prior Outpatient Therapy: No Prior Therapy Dates: NA Prior Therapy Facilty/Provider(s): NA Reason for Treatment: NA Does patient have an ACCT team?: No Does patient have Intensive In-House Services?  : No Does patient have [facility] services? : No Does patient have [facility] services?: No  ADL Screening (condition at time of admission) Patient's cognitive ability adequate to safely complete daily activities?: Yes Is the patient deaf or have difficulty hearing?: No Does the patient have difficulty seeing, even when wearing glasses/contacts?: No Does the patient have difficulty concentrating, remembering, or making decisions?: No Patient able to express need for assistance with ADLs?: Yes Does the patient have difficulty dressing or bathing?: No Independently performs ADLs?: Yes (appropriate for developmental age) Does the patient have difficulty walking or climbing stairs?: No Weakness of Legs: None Weakness of Arms/Hands: None  Home Assistive Devices/Equipment Home Assistive Devices/Equipment: None    Abuse/Neglect Assessment (Assessment to be complete while patient is alone) Abuse/Neglect Assessment Can Be Completed: Yes Physical Abuse: Denies(Pt denies. ) Verbal Abuse: Denies(Pt denies. ) Sexual Abuse: Denies(Pt denies. ) Exploitation of patient/patient's resources: Denies(Pt denies. ) Self-Neglect: Denies(Pt denies. )     Advance Directives (For Healthcare) Does Patient Have a Medical Advance Directive?: No Would patient like information on creating a medical advance directive?: No - Patient declined    Additional Information 1:1 In Past 12 Months?: No CIRT Risk: No Elopement Risk: No Does patient have medical clearance?: Yes     Disposition: Nira ConnJason Berry, NP recommends discharged with OPT resources. Pt signed a [employer] manag[employer]o-harm contract. Disposition discussed with Hope, NP and Jari Favrescar,  RN.   Disposition Initial  Assessment Completed for this Encounter: Yes Disposition of Patient: Discharge with Outpatient Resources  On Site Evaluation by:  Jenny Reichmann, MC, LPC, CRC. Reviewed with Physician:  Bridgette Habermann, NP and Nira Conn, NP.  Redmond Pulling 09/10/2017 8:45 PM   Redmond Pulling, MS, [facility], [facility] Triage Specialist [phone]

## 2017-09-10 NOTE — ED Notes (Signed)
PT DISCHARGED. INSTRUCTIONS GIVEN. AAOX4. PT IN NO APPARENT DISTRESS OR PAIN. THE OPPORTUNITY TO ASK QUESTIONS WAS PROVIDED. 

## 2017-09-10 NOTE — ED Notes (Signed)
Pt is resting calm in room. Pt is alert and oriented x 4 and is verbally responsive. Pt denies any pain or discomfort at this time. Pt states needs medication for anxiety, Pt states that he was recently prescribed Trazodone, pt reports was not effective.

## 2017-09-10 NOTE — Discharge Instructions (Signed)
Follow up as discussed with the counselor today and the information she gave you for referral. Return here as needed.

## 2017-09-10 NOTE — ED Provider Notes (Signed)
[facility]-EMERGENCY DEPT Provider Note   CSN: [id number] Arrival date & time: 09/10/17  1539     History   Chief Complaint Chief Complaint  Patient presents with  . Panic Attack    HPI Jose Hall is a 31 y.o. male who presents to the ED with a panic attack. Patient concerned that his heart is racing although he states he now feels a little better.   Patient denies any heavy caffeine intake or energy drink use.  Patient cannot think of any specific evoking factors for his palpitations.  Patient also thinks that when he gets the palpitations he starts getting panicked, and in the past he was prescribed trazodone for anxiety from an urgent care but did not take the medication due to possible side effects.Patient denies any history of substance abuse, heavy smoking.  Patient has no premature CAD, dysrhythmia, sudden unexpected death in the family and he also denies any AICD or pacemakers within the immediate family.  HPI  Past Medical History:  Diagnosis Date  . Asthma   . Hypertension     Patient Active Problem List   Diagnosis Date Noted  . History of palpitations 09/09/2017  . Dyspnea on exertion 09/09/2017  . Anxiety about health 09/09/2017  . Suspected exposure to mold 09/09/2017  . History of asthma 12/23/2006    History reviewed. No pertinent surgical history.     Home Medications    Prior to Admission medications   Medication Sig Start Date End Date Taking? Authorizing Provider  albuterol (PROVENTIL HFA;VENTOLIN HFA) 108 (90 Base) MCG/ACT inhaler Inhale 1-2 puffs into the lungs every 6 (six) hours as needed for wheezing or shortness of breath. 08/23/17   Teressa LowerPickering, Vrinda, NP    Family History Family History  Problem Relation Age of Onset  . Hypertension Other   . Diabetes Other   . Asthma Father     Social History Social History   Tobacco Use  . Smoking status: Former Games developermoker  . Smokeless tobacco: Never Used  Substance Use  Topics  . Alcohol use: No  . Drug use: No     Allergies   Patient has no known allergies.   Review of Systems Review of Systems  Cardiovascular: Positive for palpitations.  Psychiatric/Behavioral: The patient is nervous/anxious.   All other systems reviewed and are negative.  Patient reports that by the time he arrived to the ED his symptoms have almost resolved.  Physical Exam Updated Vital Signs BP (!) 145/97   Pulse 85   Temp 98.7 F (37.1 C) (Oral)   Resp 20   Ht 5\' 7"  (1.702 m)   Wt 71.7 kg (158 lb)   SpO2 100%   BMI 24.75 kg/m   Physical Exam  Constitutional: He appears well-developed and well-nourished.  HENT:  Head: Normocephalic.  Eyes: EOM are normal.  Neck: Neck supple.  Cardiovascular: Normal rate and regular rhythm.  Pulmonary/Chest: Effort normal and breath sounds normal.  Musculoskeletal: Normal range of motion.  Neurological: He is alert.  Skin: Skin is warm and dry.  Psychiatric: He has a normal mood and affect. His behavior is normal.  Patient does not appear anxious at this time.  Nursing note and vitals reviewed.    ED Treatments / Results  Labs (all labs ordered are listed, but only abnormal results are displayed) Labs Reviewed - No data to display  Radiology No results found.  Procedures Procedures (including critical care time)  Medications Ordered in ED Medications -  No data to display   Initial Impression / Assessment and Plan / ED Course  I have reviewed the triage vital signs and the nursing notes.  31 y.o. male with reported anxiety that causes him to feel like his heart is beating fast. Patient has been evaluated after his last visit to the ED with a cardiologist who wants the patient to wear a heart monitor so he can evaluate what is happening during one of the episodes. Patient feels that it is not cardiac and request to speak with a counselor. [facility] and given referral for a doctor and  counselor. Patient agrees to follow up. He will also keep his cardiology f/u appointment. Return precautions discussed.     Final Clinical Impressions(s) / ED Diagnoses   Final diagnoses:  Anxiety    ED Discharge Orders    None       Kerrie Buffaloeese, Najma Bozarth [place], [place] 09/11/17 1751    Arby BarrettePfeiffer, Marcy, MD 09/12/17 1530

## 2017-09-22 ENCOUNTER — Ambulatory Visit (HOSPITAL_COMMUNITY)
Admission: EM | Admit: 2017-09-22 | Discharge: 2017-09-22 | Disposition: A | Discharge status: Home / Self Care | Payer: Self-pay | Attending: Family Medicine | Admitting: Family Medicine

## 2017-09-22 ENCOUNTER — Emergency Department (HOSPITAL_COMMUNITY)
Admission: EM | Admit: 2017-09-22 | Discharge: 2017-09-22 | Disposition: A | Discharge status: Home / Self Care | Payer: Self-pay | Attending: Emergency Medicine | Admitting: Emergency Medicine

## 2017-09-22 ENCOUNTER — Encounter (HOSPITAL_COMMUNITY): Payer: Self-pay | Admitting: Emergency Medicine

## 2017-09-22 ENCOUNTER — Other Ambulatory Visit: Payer: Self-pay

## 2017-09-22 ENCOUNTER — Encounter (HOSPITAL_COMMUNITY): Payer: Self-pay | Admitting: Family Medicine

## 2017-09-22 DIAGNOSIS — I10 Essential (primary) hypertension: Secondary | ICD-10-CM | POA: Insufficient documentation

## 2017-09-22 DIAGNOSIS — Z87891 Personal history of nicotine dependence: Secondary | ICD-10-CM | POA: Insufficient documentation

## 2017-09-22 DIAGNOSIS — R0602 Shortness of breath: Secondary | ICD-10-CM | POA: Insufficient documentation

## 2017-09-22 DIAGNOSIS — J45909 Unspecified asthma, uncomplicated: Secondary | ICD-10-CM | POA: Insufficient documentation

## 2017-09-22 DIAGNOSIS — F410 Panic disorder [episodic paroxysmal anxiety] without agoraphobia: Secondary | ICD-10-CM

## 2017-09-22 MED ORDER — IPRATROPIUM-ALBUTEROL 0.5-2.5 (3) MG/3ML IN SOLN
3.0000 mL | Freq: Once | RESPIRATORY_TRACT | Status: AC
  Administered 2017-09-22: 3 mL via RESPIRATORY_TRACT
  Filled 2017-09-22: qty 3

## 2017-09-22 MED ORDER — PAROXETINE HCL 20 MG PO TABS: 20.0000 mg | ORAL_TABLET | Freq: Every day | ORAL | 5 refills | Status: DC

## 2017-09-22 NOTE — Discharge Instructions (Signed)
Your EKG today was normal. Continue outpatient cardiac and pulmonology workup. Please see attached information on 24/7 open care of [facility], I recommend to go there and attempted to establish care to see a therapist for some cognitive behavioral therapy to help you establish coping mechanisms for anxiety.

## 2017-09-22 NOTE — ED Notes (Signed)
Pt is alert and oriented x 4 and is verbally responsive. Pt is calm and breathing unlabored. PT states " I don't think I need an EKG" and reports that in the past they are all normal and is declining the EKG. Pt also reports that he is taking Trazodone, and thinks that it may be a cause for his breathing issues. Pt admi. Neb txmt.

## 2017-09-22 NOTE — ED Provider Notes (Signed)
[facility]-EMERGENC[facility] DEPT Provider Note CSN: [id number] Arrival date & time: 09/22/17  1002  History   Chief Complaint Chief Complaint  Patient presents with  . Asthma    HPI Margit HanksLamar Q Lipscomb is a 31 y.o. male history of anxiety presents w/ non exertional SOB, persistent, chronic ongoing for over 3 months. Describes shortness of breath as "I have to take a deeper breath to feel comfortable". Shortness of breath is worse at nighttime and in the morning. States that going on a walk in the cold air or exercising "opens up his chest" and makes his breathing better. Symptoms started around the time when he lost his brother and was going through a difficult relationship. At the same time, he was living in a place with mold but moved out one month ago. Since, he has been diagnosed w anxiety and is taking trazodone. Thinks his anxiety is better controlled.   Patient has been to ED for same several times. States he has been told his heart, lungs are okay and he doesn't have blood clots. He denies exertional or pleuritic chest pain, palpitations, dizziness, lightheadedness, cough, fevers, chills, abdominal pain, nausea, vomiting. No known cardiac or valvular disorders, DVT/PE. No family history of abnormal rhythms or sudden onset cardiac death.  HPI  Past Medical History:  Diagnosis Date  . Asthma   . Hypertension     Patient Active Problem List   Diagnosis Date Noted  . History of palpitations 09/09/2017  . Dyspnea on exertion 09/09/2017  . Anxiety about health 09/09/2017  . Suspected exposure to mold 09/09/2017  . History of asthma 12/23/2006    History reviewed. No pertinent surgical history.     Home Medications    Prior to Admission medications   Medication Sig Start Date End Date Taking? Authorizing Provider  albuterol (PROVENTIL HFA;VENTOLIN HFA) 108 (90 Base) MCG/ACT inhaler Inhale 1-2 puffs into the lungs every 6 (six) hours as needed for wheezing or  shortness of breath. 08/23/17   Teressa LowerPickering, Vrinda, NP    Family History Family History  Problem Relation Age of Onset  . Hypertension Other   . Diabetes Other   . Asthma Father     Social History Social History   Tobacco Use  . Smoking status: Former Games developermoker  . Smokeless tobacco: Never Used  Substance Use Topics  . Alcohol use: No  . Drug use: No     Allergies   Patient has no known allergies.   Review of Systems Review of Systems  Respiratory: Positive for shortness of breath.   All other systems reviewed and are negative.    Physical Exam Updated Vital Signs BP (!) 152/108 (BP Location: Right Arm)   Pulse 77   Temp 98 F (36.7 C) (Oral)   Resp 20   Ht 5\' 7"  (1.702 m)   Wt 72.6 kg (160 lb)   SpO2 100%   BMI 25.06 kg/m   Physical Exam  Constitutional: He is oriented to person, place, and time. He appears well-developed and well-nourished. No distress.  NAD.  HENT:  Head: Normocephalic and atraumatic.  Right Ear: External ear normal.  Left Ear: External ear normal.  Nose: Nose normal.  Eyes: Conjunctivae and EOM are normal. No scleral icterus.  Neck: Normal range of motion. Neck supple.  Cardiovascular: Normal rate, regular rhythm, normal heart sounds and intact distal pulses.  No murmur heard. No LE edema. Calves nontender.   Pulmonary/Chest: Effort normal and breath sounds normal. He has no  wheezes.  RR within normal limits. SpO2 within normal limits.  Lungs CTAB anteriorly and posteriorly without wheezing, rhonchi or crackles.  No egophony.  Normal breathing effort. Patient speaking in full sentences. No pursed lip breathing. No cyanosis. Chest wall expansion symmetric.  No chest wall tenderness.No JVD. No orthopnea.  Musculoskeletal: Normal range of motion. He exhibits no deformity.  Neurological: He is alert and oriented to person, place, and time.  Skin: Skin is warm and dry. Capillary refill takes less than 2 seconds.  Psychiatric: He has a  normal mood and affect. His behavior is normal. Judgment and thought content normal.  Nursing note and vitals reviewed.    ED Treatments / Results  Labs (all labs ordered are listed, but only abnormal results are displayed) Labs Reviewed - No data to display  EKG  EKG Interpretation None       Radiology No results found.  Procedures Procedures (including critical care time)  Medications Ordered in ED Medications  ipratropium-albuterol (DUONEB) 0.5-2.5 (3) MG/3ML nebulizer solution 3 mL (3 mLs Nebulization Given 09/22/17 1136)     Initial Impression / Assessment and Plan / ED Course  I have reviewed the triage vital signs and the nursing notes.  Pertinent labs & imaging results that were available during my care of the patient were reviewed by me and considered in my medical decision making (see chart for details).    31 year old male presents with shortness of breath. This appears to be chronic, nonexertional. Improves with exercise and taking walks in the cold. Shortness of breath is worse in the morning and at nighttime. Symptoms started around the same time he lost his brother and was going through a had relationship. At the same time he was living in an apartment with mold. He has been diagnosed with anxiety and is taking trazodone, states his anxiety is better controlled.  Chart review reveals patient has been to the ED for similar complaints in the past. He has had multiple cardiac evaluations in the ED which have been benign. Vital signs are within normal limits. No tachycardia or tachypnea. Normal oxygen saturations. He is PERC negative. Per chart review, patient recently evaluated by cardiology 2 weeks ago. Per cardiology note he is scheduled for an echocardiogram and heart monitor to rule out cardiac cause. Cardiology considering pulmonology referral if cardiac workup is benign.    Final Clinical Impressions(s) / ED Diagnoses   Pt refused EKG today. Low suspicion  for ACS in this patient. Suspect anxiety component contributing. Will d/c with cardiology and pulmonology f/u. [facility] care with counselor. Discussed return precautions. He is agreeable.  Final diagnoses:  Shortness of breath    ED Discharge Orders    None       Jerrell MylarGibbons, Ashleen Demma J, PA-C 09/22/17 1242    Lorre NickAllen, Anthony, MD 09/27/17 [phone]

## 2017-09-22 NOTE — ED Triage Notes (Signed)
Pt is here after being seen in the ED and treated for SOB earlier today.  Pt does not currently appear to be SOB.  He states he thinks the Trazadone he was prescribed back in September is exacerbating his SOB.

## 2017-09-22 NOTE — ED Provider Notes (Signed)
[id number] [facility]   [id number] 09/22/17 Arrival Time: 1358   SUBJECTIVE:  Jose Hall is a 31 y.o. male who presents to the urgent care with complaint of shortness of breath. Patient having panic attacks at least once a week.  Note from ED 4 hours earlier:  HPI Jose Hall is a 31 y.o. male history of anxiety presents w/ non exertional SOB, persistent, chronic ongoing for over 3 months. Describes shortness of breath as "I have to take a deeper breath to feel comfortable". Shortness of breath is worse at nighttime and in the morning. States that going on a walk in the cold air or exercising "opens up his chest" and makes his breathing better. Symptoms started around the time when he lost his brother and was going through a difficult relationship. At the same time, he was living in a place with mold but moved out one month ago. Since, he has been diagnosed w anxiety and is taking trazodone. Thinks his anxiety is better controlled.   Patient has been to ED for same several times. States he has been told his heart, lungs are okay and he doesn't have blood clots. He denies exertional or pleuritic chest pain, palpitations, dizziness, lightheadedness, cough, fevers, chills, abdominal pain, nausea, vomiting. No known cardiac or valvular disorders, DVT/PE. No family history of abnormal rhythms or sudden onset cardiac death.  HPI   Past Medical History:  Diagnosis Date  . Asthma   . Hypertension    Family History  Problem Relation Age of Onset  . Hypertension Other   . Diabetes Other   . Asthma Father    Social History   Socioeconomic History  . Marital status: Single    Spouse name: Not on file  . Number of children: Not on file  . Years of education: Not on file  . Highest education level: Not on file  Social Needs  . Financial resource strain: Not on file  . Food insecurity - worry: Not on file  . Food insecurity - inability: Not on file  . Transportation needs -  medical: Not on file  . Transportation needs - non-medical: Not on file  Occupational History  . Not on file  Tobacco Use  . Smoking status: Former Games developermoker  . Smokeless tobacco: Never Used  Substance and Sexual Activity  . Alcohol use: No  . Drug use: No  . Sexual activity: Yes  Other Topics Concern  . Not on file  Social History Narrative  . Not on file   No outpatient medications have been marked as taking for the 09/22/17 encounter [facility](Hospital Encounter).   No Known Allergies    ROS: As per HPI, remainder of ROS negative.   OBJECTIVE:   Vitals:   09/22/17 1421  BP: (!) 147/96  Pulse: 76  Temp: 98.1 F (36.7 C)  TempSrc: Oral  SpO2: 98%     General appearance: alert; no distress Eyes: PERRL; EOMI; conjunctiva normal HENT: normocephalic; atraumatic; TMs normal, canal normal, external ears normal without trauma; nasal mucosa normal; oral mucosa normal Neck: supple Lungs: clear to auscultation bilaterally Heart: regular rate and rhythm Back: no CVA tenderness Extremities: no cyanosis or edema; symmetrical with no gross deformities Skin: warm and dry Neurologic: normal gait; grossly normal Psychological: alert and cooperative; normal mood and affect      Labs:  Results for orders placed or performed during the hospital encounter of 09/01/17  Basic metabolic panel  Result Value Ref Range   Sodium  137 135 - 145 mmol/L   Potassium 4.2 3.5 - 5.1 mmol/L   Chloride 103 101 - 111 mmol/L   CO2 25 22 - 32 mmol/L   Glucose, Bld 110 (H) 65 - 99 mg/dL   BUN 19 6 - 20 mg/dL   Creatinine, Ser 9.561.00 0.61 - 1.24 mg/dL   Calcium 9.0 8.9 - 21.310.3 mg/dL   GFR calc non Af Amer >60 >60 mL/min   GFR calc Af Amer >60 >60 mL/min   Anion gap 9 5 - 15  CBC  Result Value Ref Range   WBC 11.3 (H) 4.0 - 10.5 K/uL   RBC 5.19 4.22 - 5.81 MIL/uL   Hemoglobin 14.9 13.0 - 17.0 g/dL   HCT 08.640.6 57.839.0 - 46.952.0 %   MCV 78.2 78.0 - 100.0 fL   MCH 28.7 26.0 - 34.0 pg   MCHC 36.7 (H) 30.0 -  36.0 g/dL   RDW 62.913.8 52.811.5 - 41.315.5 %   Platelets 246 150 - 400 K/uL  Magnesium  Result Value Ref Range   Magnesium 2.1 1.7 - 2.4 mg/dL    Labs Reviewed - No data to display  No results found.     ASSESSMENT & PLAN:  1. Panic disorder     Meds ordered this encounter  Medications  . PARoxetine (PAXIL) 20 MG tablet    Sig: Take 1 tablet (20 mg total) by mouth at bedtime.    Dispense:  30 tablet    Refill:  5    Reviewed expectations re: course of current medical issues. Questions answered. Outlined signs and symptoms indicating need for more acute intervention. Patient verbalized understanding. After Visit Summary given.       Elvina SidleLauenstein, Jakyia Gaccione, MD 09/22/17 1433

## 2017-09-22 NOTE — ED Triage Notes (Signed)
Patient reports that for over month had intermittent SOb with his asthma, states usually happens in night and mornings when wakes up. Reports that he uses inhaler and sometimes will help.  Denies cough or congestion

## 2017-09-24 ENCOUNTER — Ambulatory Visit (HOSPITAL_COMMUNITY)
Admission: EM | Admit: 2017-09-24 | Discharge: 2017-09-24 | Disposition: A | Discharge status: Home / Self Care | Payer: Self-pay | Attending: Family Medicine | Admitting: Family Medicine

## 2017-09-24 ENCOUNTER — Telehealth (HOSPITAL_COMMUNITY): Payer: Self-pay | Admitting: Family Medicine

## 2017-09-24 ENCOUNTER — Encounter (HOSPITAL_COMMUNITY): Payer: Self-pay | Admitting: Emergency Medicine

## 2017-09-24 DIAGNOSIS — F410 Panic disorder [episodic paroxysmal anxiety] without agoraphobia: Secondary | ICD-10-CM

## 2017-09-24 DIAGNOSIS — R42 Dizziness and giddiness: Secondary | ICD-10-CM

## 2017-09-24 MED ORDER — BUSPIRONE HCL 5 MG PO TABS: 5.0000 mg | ORAL_TABLET | Freq: Two times a day (BID) | ORAL | 1 refills | Status: DC | PRN

## 2017-09-24 MED ORDER — BUSPIRONE HCL 5 MG PO TABS
5.0000 mg | ORAL_TABLET | Freq: Two times a day (BID) | ORAL | 1 refills | Status: DC | PRN
Start: 2017-09-24 — End: 2017-09-24

## 2017-09-24 NOTE — Discharge Instructions (Signed)
[facility]

## 2017-09-24 NOTE — ED Triage Notes (Signed)
PT C/O: SOB   ONSET: 1-2 months  SX ALSO INCLUDE: hx of anxiety.... sts it feels like a "panic attack" w/heart racing, LH, dizzy  DENIES:   TAKING MEDS: none  A&O x4... NAD... Ambulatory   

## 2017-09-24 NOTE — ED Provider Notes (Signed)
[facility]   [id number] 09/24/17 Arrival Time: 1248   SUBJECTIVE:  Jose Hall is a 31 y.o. male who presents to the urgent care with complaint of panic disorder. Patient's been having multiple visits for shortness of breath and feeling off balance. He was switched to Paxil until his first dose last night but he still feels short of breath at times and dizzy.  He can identify no precipitating events in his life although he says he is very stressed. He came with his girlfriend today.     Past Medical History:  Diagnosis Date  . Asthma   . Hypertension    Family History  Problem Relation Age of Onset  . Hypertension Other   . Diabetes Other   . Asthma Father    Social History   Socioeconomic History  . Marital status: Single    Spouse name: Not on file  . Number of children: Not on file  . Years of education: Not on file  . Highest education level: Not on file  Social Needs  . Financial resource strain: Not on file  . Food insecurity - worry: Not on file  . Food insecurity - inability: Not on file  . Transportation needs - medical: Not on file  . Transportation needs - non-medical: Not on file  Occupational History  . Not on file  Tobacco Use  . Smoking status: Former Games developermoker  . Smokeless tobacco: Never Used  Substance and Sexual Activity  . Alcohol use: No  . Drug use: No  . Sexual activity: Yes  Other Topics Concern  . Not on file  Social History Narrative  . Not on file   No outpatient medications have been marked as taking for the 09/24/17 encounter [facility](Hospital Encounter).   No Known Allergies    ROS: As per HPI, remainder of ROS negative.   OBJECTIVE:   Vitals:   09/24/17 1258  BP: (!) 166/97  Pulse: (!) 110  Resp: (!) 22  Temp: 98.4 F (36.9 C)  TempSrc: Oral  SpO2: 100%     General appearance: alert; no distress Eyes: PERRL; EOMI; conjunctiva normal HENT: normocephalic; atraumatic;  Neck: supple Lungs: clear to  auscultation bilaterally Heart: regular rate and rhythm Back: no CVA tenderness Extremities: no cyanosis or edema; symmetrical with no gross deformities Skin: warm and dry Neurologic: normal gait; grossly normal Psychological:  patient is crying and feeling scared.   I spent 20 minutes talking the patient after which we decided [facility]   Labs:  Results for orders placed or performed during the hospital encounter of 09/01/17  Basic metabolic panel  Result Value Ref Range   Sodium 137 135 - 145 mmol/L   Potassium 4.2 3.5 - 5.1 mmol/L   Chloride 103 101 - 111 mmol/L   CO2 25 22 - 32 mmol/L   Glucose, Bld 110 (H) 65 - 99 mg/dL   BUN 19 6 - 20 mg/dL   Creatinine, Ser 7.821.00 0.61 - 1.24 mg/dL   Calcium 9.0 8.9 - 95.610.3 mg/dL   GFR calc non Af Amer >60 >60 mL/min   GFR calc Af Amer >60 >60 mL/min   Anion gap 9 5 - 15  CBC  Result Value Ref Range   WBC 11.3 (H) 4.0 - 10.5 K/uL   RBC 5.19 4.22 - 5.81 MIL/uL   Hemoglobin 14.9 13.0 - 17.0 g/dL   HCT 21.340.6 08.639.0 -  52.0 %   MCV 78.2 78.0 - 100.0 fL   MCH 28.7 26.0 - 34.0 pg   MCHC 36.7 (H) 30.0 - 36.0 g/dL   RDW 78.213.8 95.611.5 - 21.315.5 %   Platelets 246 150 - 400 K/uL  Magnesium  Result Value Ref Range   Magnesium 2.1 1.7 - 2.4 mg/dL    Labs Reviewed - No data to display  No results found.     ASSESSMENT & PLAN:  1. Panic disorder     Meds ordered this encounter  Medications  . busPIRone (BUSPAR) 5 MG tablet    Sig: Take 1 tablet (5 mg total) by mouth 2 (two) times daily as needed.    Dispense:  30 tablet    Refill:  1    Reviewed expectations re: course of current medical issues. Questions answered. Outlined signs and symptoms indicating need for more acute intervention. Patient verbalized understanding. After Visit Summary given.     Elvina SidleLauenstein, Akiera Allbaugh, MD 09/24/17 1323

## 2017-09-24 NOTE — ED Triage Notes (Signed)
PT C/O: SOB   ONSET: 1-2 months  SX ALSO INCLUDE: hx of anxiety.... sts it feels like a "panic attack" w/heart racing, LH, dizzy  DENIES:   TAKING MEDS: none  A&O x4... NAD... Ambulatory

## 2017-09-26 ENCOUNTER — Encounter (HOSPITAL_COMMUNITY): Payer: Self-pay | Admitting: Emergency Medicine

## 2017-09-26 ENCOUNTER — Emergency Department (HOSPITAL_COMMUNITY)
Admission: EM | Admit: 2017-09-26 | Discharge: 2017-09-26 | Disposition: A | Discharge status: Home / Self Care | Payer: Self-pay | Attending: Emergency Medicine | Admitting: Emergency Medicine

## 2017-09-26 ENCOUNTER — Emergency Department (HOSPITAL_COMMUNITY): Payer: Self-pay

## 2017-09-26 ENCOUNTER — Other Ambulatory Visit: Payer: Self-pay

## 2017-09-26 DIAGNOSIS — Z87891 Personal history of nicotine dependence: Secondary | ICD-10-CM | POA: Insufficient documentation

## 2017-09-26 DIAGNOSIS — E876 Hypokalemia: Secondary | ICD-10-CM | POA: Insufficient documentation

## 2017-09-26 DIAGNOSIS — Z79899 Other long term (current) drug therapy: Secondary | ICD-10-CM | POA: Insufficient documentation

## 2017-09-26 DIAGNOSIS — F410 Panic disorder [episodic paroxysmal anxiety] without agoraphobia: Secondary | ICD-10-CM | POA: Insufficient documentation

## 2017-09-26 DIAGNOSIS — J9801 Acute bronchospasm: Secondary | ICD-10-CM | POA: Insufficient documentation

## 2017-09-26 DIAGNOSIS — I10 Essential (primary) hypertension: Secondary | ICD-10-CM | POA: Insufficient documentation

## 2017-09-26 LAB — I-STAT CHEM 8, ED
BUN: 12 mg/dL (ref 6–20)
CHLORIDE: 105 mmol/L (ref 101–111)
CREATININE: 1.1 mg/dL (ref 0.61–1.24)
Calcium, Ion: 1.14 mmol/L — ABNORMAL LOW (ref 1.15–1.40)
Glucose, Bld: 95 mg/dL (ref 65–99)
HEMATOCRIT: 46 % (ref 39.0–52.0)
Hemoglobin: 15.6 g/dL (ref 13.0–17.0)
POTASSIUM: 3.2 mmol/L — AB (ref 3.5–5.1)
SODIUM: 140 mmol/L (ref 135–145)
TCO2: 23 mmol/L (ref 22–32)

## 2017-09-26 LAB — I-STAT TROPONIN, ED: Troponin i, poc: 0.01 ng/mL (ref 0.00–0.08)

## 2017-09-26 MED ORDER — POTASSIUM CHLORIDE CRYS ER 20 MEQ PO TBCR
40.0000 meq | EXTENDED_RELEASE_TABLET | Freq: Once | ORAL | Status: AC
  Administered 2017-09-26: 40 meq via ORAL
  Filled 2017-09-26: qty 2

## 2017-09-26 MED ORDER — METHYLPREDNISOLONE SODIUM SUCC 125 MG IJ SOLR
125.0000 mg | Freq: Once | INTRAMUSCULAR | Status: AC
  Administered 2017-09-26: 125 mg via INTRAMUSCULAR
  Filled 2017-09-26: qty 2

## 2017-09-26 MED ORDER — PREDNISONE 20 MG PO TABS: ORAL_TABLET | ORAL | 0 refills | Status: DC

## 2017-09-26 MED ORDER — ALPRAZOLAM 0.25 MG PO TABS: 0.2500 mg | ORAL_TABLET | Freq: Three times a day (TID) | ORAL | 0 refills | Status: DC | PRN

## 2017-09-26 MED ORDER — ALBUTEROL SULFATE (2.5 MG/3ML) 0.083% IN NEBU
5.0000 mg | INHALATION_SOLUTION | Freq: Once | RESPIRATORY_TRACT | Status: AC
  Administered 2017-09-26: 5 mg via RESPIRATORY_TRACT
  Filled 2017-09-26: qty 6

## 2017-09-26 NOTE — ED Provider Notes (Signed)
[facility]   CSN: [id number] Arrival date & time: 09/26/17  2051     History   Chief Complaint Chief Complaint  Patient presents with  . Shortness of Breath    HPI Jose Hall is a 31 y.o. male.  HPI Patient presents with 3 months of shortness of breath.  States he feels like he has taken difficulty taking a full deep breath.  Believes this causes him to have anxiety which she describes as palpitations and tremors.  Had anxiety attack tonight which is now resolved.  States he continues to have some shortness of breath.  Denies any chest pain.  No fever or chills.  No cough.  No new lower extremity swelling or pain.  States he has appointment to see a cardiologist next week. Past Medical History:  Diagnosis Date  . Asthma   . Hypertension     Patient Active Problem List   Diagnosis Date Noted  . History of palpitations 09/09/2017  . Dyspnea on exertion 09/09/2017  . Anxiety about health 09/09/2017  . Suspected exposure to mold 09/09/2017  . History of asthma 12/23/2006    History reviewed. No pertinent surgical history.     Home Medications    Prior to Admission medications   Medication Sig Start Date End Date Taking? Authorizing Provider  albuterol (PROVENTIL HFA;VENTOLIN HFA) 108 (90 Base) MCG/ACT inhaler Inhale 1-2 puffs into the lungs every 6 (six) hours as needed for wheezing or shortness of breath. 08/23/17   Teressa Lower, NP  ALPRAZolam Prudy Feeler) 0.25 MG tablet Take 1 tablet (0.25 mg total) by mouth 3 (three) times daily as needed for anxiety. 09/26/17   Loren Racer, MD  busPIRone (BUSPAR) 5 MG tablet Take 1 tablet (5 mg total) by mouth 2 (two) times daily as needed. 09/24/17   Elvina Sidle, MD  PARoxetine (PAXIL) 20 MG tablet Take 1 tablet (20 mg total) by mouth at bedtime. 09/22/17   Elvina Sidle, MD  predniSONE (DELTASONE) 20 MG tablet 3 tabs po day one, then 2 po daily x 4 days 09/27/17    Loren Racer, MD    Family History Family History  Problem Relation Age of Onset  . Hypertension Other   . Diabetes Other   . Asthma Father     Social History Social History   Tobacco Use  . Smoking status: Former Games developer  . Smokeless tobacco: Never Used  Substance Use Topics  . Alcohol use: No  . Drug use: No     Allergies   Patient has no known allergies.   Review of Systems Review of Systems  Constitutional: Negative for chills and fever.  HENT: Negative for congestion, sinus pressure and sinus pain.   Eyes: Negative for visual disturbance.  Respiratory: Positive for chest tightness and shortness of breath. Negative for cough.   Cardiovascular: Positive for palpitations. Negative for chest pain and leg swelling.  Gastrointestinal: Negative for abdominal pain, diarrhea, nausea and vomiting.  Genitourinary: Negative for dysuria, flank pain and frequency.  Musculoskeletal: Negative for back pain, myalgias and neck pain.  Skin: Negative for rash and wound.  Neurological: Positive for tremors and light-headedness. Negative for syncope, weakness, numbness and headaches.  Psychiatric/Behavioral: The patient is nervous/anxious.   All other systems reviewed and are negative.    Physical Exam Updated Vital Signs BP (!) 144/111 (BP Location: Left Arm)   Pulse 78   Temp (!) 97.1 F (36.2 C) (Axillary)   Resp 15  Ht 5\' 8"  (1.727 m)   Wt 72.6 kg (160 lb)   SpO2 98%   BMI 24.33 kg/m   Physical Exam  Constitutional: He is oriented to person, place, and time. He appears well-developed and well-nourished.  Non-toxic appearance. He does not appear ill. No distress.  HENT:  Head: Normocephalic and atraumatic.  Mouth/Throat: Oropharynx is clear and moist.  Eyes: EOM are normal. Pupils are equal, round, and reactive to light.  Neck: Normal range of motion. Neck supple.  Cardiovascular: Normal rate and regular rhythm.  No murmur heard. Pulmonary/Chest: Effort normal.  He has decreased breath sounds. He exhibits no tenderness, no crepitus, no edema and no swelling.  No wheezing but diminished air movement throughout.  Appears most pronounced in the bilateral bases.  Abdominal: Soft. Bowel sounds are normal. There is no tenderness. There is no rebound and no guarding.  Musculoskeletal: Normal range of motion. He exhibits no edema or tenderness.  Neurological: He is alert and oriented to person, place, and time.  Skin: Skin is warm and dry. Capillary refill takes less than 2 seconds. No rash noted. No erythema.  Psychiatric: His behavior is normal. His mood appears anxious.  Nursing note and vitals reviewed.    ED Treatments / Results  Labs (all labs ordered are listed, but only abnormal results are displayed) Labs Reviewed  I-STAT CHEM 8, ED - Abnormal; Notable for the following components:      Result Value   Potassium 3.2 (*)    Calcium, Ion 1.14 (*)    All other components within normal limits  I-STAT TROPONIN, ED    EKG  EKG Interpretation  Date/Time:  Sunday September 26 2017 21:24:56 EST Ventricular Rate:  72 PR Interval:    QRS Duration: 87 QT Interval:  365 QTC Calculation: 400 R Axis:   103 Text Interpretation:  Sinus rhythm Right axis deviation Probable anteroseptal infarct, old ST elevation suggests acute pericarditis Confirmed by Loren RacerYelverton, Vartan Kerins ([id number]) on 09/26/2017 9:37:43 PM       Radiology Dg Chest 2 View  Result Date: 09/26/2017 CLINICAL DATA:  Acute shortness of breath. EXAM: CHEST  2 VIEW COMPARISON:  09/01/2017 and prior chest radiographs FINDINGS: The cardiomediastinal silhouette is unremarkable. There is no evidence of focal airspace disease, pulmonary edema, suspicious pulmonary nodule/mass, pleural effusion, or pneumothorax. No acute bony abnormalities are identified. IMPRESSION: No active cardiopulmonary disease. Electronically Signed   By: Harmon PierJeffrey  Hu M.D.   On: 09/26/2017 22:28    Procedures Procedures  (including critical care time)  Medications Ordered in ED Medications  methylPREDNISolone sodium succinate (SOLU-MEDROL) 125 mg/2 mL injection 125 mg (not administered)  potassium chloride SA (K-DUR,KLOR-CON) CR tablet 40 mEq (not administered)  albuterol (PROVENTIL) (2.5 MG/3ML) 0.083% nebulizer solution 5 mg (5 mg Nebulization Given 09/26/17 2141)     Initial Impression / Assessment and Plan / ED Course  I have reviewed the triage vital signs and the nursing notes.  Pertinent labs & imaging results that were available during my care of the patient were reviewed by me and considered in my medical decision making (see chart for details).     Suspect some type of reactive airway disease and anxiety.  Low suspicion for CAD/PE. Patient states he is feeling much better after albuterol treatment.  Increased air movement throughout.  Will give short course of steroids.  Also give prescription for low-dose Xanax for as needed anxiety attacks.  Advised to follow-up with his cardiologist and primary physician.  Return precautions given.  Final Clinical Impressions(s) / ED Diagnoses   Final diagnoses:  Bronchospasm  Anxiety attack  Hypokalemia    ED Discharge Orders        Ordered    predniSONE (DELTASONE) 20 MG tablet     09/26/17 2250    ALPRAZolam (XANAX) 0.25 MG tablet  3 times daily PRN     09/26/17 2250       Loren RacerYelverton, Alyna Stensland, MD 09/26/17 2255

## 2017-09-28 ENCOUNTER — Encounter (HOSPITAL_COMMUNITY): Payer: Self-pay | Admitting: Emergency Medicine

## 2017-09-28 ENCOUNTER — Other Ambulatory Visit: Payer: Self-pay

## 2017-09-28 ENCOUNTER — Ambulatory Visit (HOSPITAL_COMMUNITY): Payer: Self-pay | Attending: Cardiology

## 2017-09-28 DIAGNOSIS — R0600 Dyspnea, unspecified: Secondary | ICD-10-CM | POA: Insufficient documentation

## 2017-11-26 ENCOUNTER — Telehealth: Payer: Self-pay | Admitting: Cardiovascular Disease

## 2017-11-26 NOTE — Telephone Encounter (Signed)
New message  Pt verbalized that he is calling for the RN  To get his results over the phone and he   want to know if his appt is needed for 11/30/2017

## 2017-11-26 NOTE — Telephone Encounter (Signed)
Returned call to pt he states that he has an appt scheduled 11-30-17 and he was wondering if he needs to keep it? Pt cancelled his monitor in December because he would not afford it.now he states that he does not need to have monitor placed this is why he has not called ti rescheduled He states that he had an ECHO 09-28-17 and has not received the results. Do you want to have him come to appt to discuss his current status, or cancel appt? Please advise

## 2017-11-29 NOTE — Progress Notes (Deleted)
Cardiology Office Note   Date:  11/29/2017   ID:  Jose HanksLamar Q Laine, DOB 06/30/1986, MRN [id number]  PCP:  Patient, No Pcp Per  Cardiologist:   Chilton Siiffany Continental, MD   No chief complaint on file.    History of Present Illness: Jose Hall is a 32 y.o. male with asthma and hypertension who presents for ***    Past Medical History:  Diagnosis Date  . Asthma   . Hypertension     No past surgical history on file.   Current Outpatient Medications  Medication Sig Dispense Refill  . albuterol (PROVENTIL HFA;VENTOLIN HFA) 108 (90 Base) MCG/ACT inhaler Inhale 1-2 puffs into the lungs every 6 (six) hours as needed for wheezing or shortness of breath. 1 Inhaler 0  . ALPRAZolam (XANAX) 0.25 MG tablet Take 1 tablet (0.25 mg total) by mouth 3 (three) times daily as needed for anxiety. 10 tablet 0  . busPIRone (BUSPAR) 5 MG tablet Take 1 tablet (5 mg total) by mouth 2 (two) times daily as needed. 30 tablet 1  . PARoxetine (PAXIL) 20 MG tablet Take 1 tablet (20 mg total) by mouth at bedtime. 30 tablet 5  . predniSONE (DELTASONE) 20 MG tablet 3 tabs po day one, then 2 po daily x 4 days 11 tablet 0   No current facility-administered medications for this visit.     Allergies:   Patient has no known allergies.    Social History:  The patient  reports that  has never smoked. he has never used smokeless tobacco. He reports that he does not drink alcohol or use drugs.   Family History:  The patient's ***family history includes Asthma in his father; Diabetes in his other; Hypertension in his other.    ROS:  Please see the history of present illness.   Otherwise, review of systems are positive for {NONE DEFAULTED:18576::"none"}.   All other systems are reviewed and negative.    PHYSICAL EXAM: VS:  There were no vitals taken for this visit. , BMI There is no height or weight on file to calculate BMI. GENERAL:  Well appearing HEENT:  Pupils equal round and reactive, fundi not visualized,  oral mucosa unremarkable NECK:  No jugular venous distention, waveform within normal limits, carotid upstroke brisk and symmetric, no bruits, no thyromegaly LYMPHATICS:  No cervical adenopathy LUNGS:  Clear to auscultation bilaterally HEART:  RRR.  PMI not displaced or sustained,S1 and S2 within normal limits, no S3, no S4, no clicks, no rubs, *** murmurs ABD:  Flat, positive bowel sounds normal in frequency in pitch, no bruits, no rebound, no guarding, no midline pulsatile mass, no hepatomegaly, no splenomegaly EXT:  2 plus pulses throughout, no edema, no cyanosis no clubbing SKIN:  No rashes no nodules NEURO:  Cranial nerves II through XII grossly intact, motor grossly intact throughout PSYCH:  Cognitively intact, oriented to person place and time    EKG:  EKG {ACTION; IS/IS [id number]}OT:[id number]} ordered today. The ekg ordered today demonstrates ***   Recent Labs: 09/01/2017: Magnesium 2.1; Platelets 246 09/26/2017: BUN 12; Creatinine, Ser 1.10; Hemoglobin 15.6; Potassium 3.2; Sodium 140    Lipid Panel No results found for: CHOL, TRIG, HDL, CHOLHDL, VLDL, LDLCALC, LDLDIRECT    Wt Readings from Last 3 Encounters:  09/26/17 160 lb (72.6 kg)  09/22/17 160 lb (72.6 kg)  09/10/17 158 lb (71.7 kg)      ASSESSMENT AND PLAN:  ***   Current medicines are reviewed at length with the patient  today.  The patient {ACTIONS; HAS/DOES NOT HAVE:19233} concerns regarding medicines.  The following changes have been made:  {PLAN; NO CHANGE:13088:s}  Labs/ tests ordered today include: *** No orders of the defined types were placed in this encounter.    Disposition:   FU with ***    This note was written with the assistance of speech recognition software.  Please excuse any transcriptional errors.  Signed, Allena Pietila C. Duke Salvia, MD, [facility]  11/29/2017 12:27 PM    [facility]

## 2017-11-29 NOTE — Telephone Encounter (Signed)
Left detailed message-ok to cancel appt tomorrow

## 2017-11-29 NOTE — Telephone Encounter (Signed)
I have never seen this patient.  It looks like he saw Franky MachoLuke and he ordered those studies.  This is why his echo never returned to me to comment on it.  His echo was normal.  If he does not feel like he needs to follow up that is fine.

## 2017-11-30 ENCOUNTER — Ambulatory Visit: Payer: Self-pay | Admitting: Cardiovascular Disease

## 2017-11-30 NOTE — Telephone Encounter (Signed)
Appt cancelled

## 2017-12-07 ENCOUNTER — Emergency Department (HOSPITAL_COMMUNITY): Payer: Self-pay

## 2017-12-07 ENCOUNTER — Emergency Department (HOSPITAL_COMMUNITY)
Admission: EM | Admit: 2017-12-07 | Discharge: 2017-12-07 | Disposition: A | Discharge status: Home / Self Care | Payer: Self-pay | Attending: Emergency Medicine | Admitting: Emergency Medicine

## 2017-12-07 ENCOUNTER — Encounter (HOSPITAL_COMMUNITY): Payer: Self-pay

## 2017-12-07 ENCOUNTER — Other Ambulatory Visit: Payer: Self-pay

## 2017-12-07 DIAGNOSIS — I10 Essential (primary) hypertension: Secondary | ICD-10-CM | POA: Insufficient documentation

## 2017-12-07 DIAGNOSIS — J45901 Unspecified asthma with (acute) exacerbation: Secondary | ICD-10-CM | POA: Insufficient documentation

## 2017-12-07 HISTORY — DX: Anxiety disorder, unspecified: F41.9

## 2017-12-07 MED ORDER — CETIRIZINE HCL 10 MG PO TABS: 10.0000 mg | ORAL_TABLET | Freq: Every day | ORAL | 0 refills | Status: DC

## 2017-12-07 MED ORDER — ALBUTEROL SULFATE HFA 108 (90 BASE) MCG/ACT IN AERS
1.0000 | INHALATION_SPRAY | RESPIRATORY_TRACT | Status: DC | PRN
  Administered 2017-12-07: 2 via RESPIRATORY_TRACT
  Filled 2017-12-07: qty 6.7

## 2017-12-07 MED ORDER — IPRATROPIUM-ALBUTEROL 0.5-2.5 (3) MG/3ML IN SOLN
3.0000 mL | Freq: Once | RESPIRATORY_TRACT | Status: AC
  Administered 2017-12-07: 3 mL via RESPIRATORY_TRACT
  Filled 2017-12-07: qty 3

## 2017-12-07 MED ORDER — PREDNISONE 20 MG PO TABS
60.0000 mg | ORAL_TABLET | Freq: Once | ORAL | Status: AC
  Administered 2017-12-07: 60 mg via ORAL
  Filled 2017-12-07: qty 3

## 2017-12-07 MED ORDER — ALBUTEROL SULFATE (2.5 MG/3ML) 0.083% IN NEBU
5.0000 mg | INHALATION_SOLUTION | Freq: Once | RESPIRATORY_TRACT | Status: AC
  Administered 2017-12-07: 5 mg via RESPIRATORY_TRACT
  Filled 2017-12-07: qty 6

## 2017-12-07 MED ORDER — FLUTICASONE PROPIONATE (INHAL) 50 MCG/BLIST IN AEPB: 1.0000 | INHALATION_SPRAY | Freq: Two times a day (BID) | RESPIRATORY_TRACT | 0 refills | Status: DC

## 2017-12-07 MED ORDER — PREDNISONE 20 MG PO TABS: 40.0000 mg | ORAL_TABLET | Freq: Every day | ORAL | 0 refills | Status: DC

## 2017-12-07 MED ORDER — FLUTICASONE PROPIONATE 50 MCG/ACT NA SUSP: 2.0000 | Freq: Every day | NASAL | 0 refills | Status: DC

## 2017-12-07 NOTE — ED Notes (Signed)
Patient refused EKG. PA notified.

## 2017-12-07 NOTE — ED Provider Notes (Signed)
[facility]-EMERGENCY DEPT Provider Note   CSN: [id number] Arrival date & time: 12/07/17  1322     History   Chief Complaint Chief Complaint  Patient presents with  . Shortness of Breath    HPI Jose Hall is a 32 y.o. male with a history of anxiety, asthma, hypertension who presents the emergency department today for shortness of breath.  Patient notes that over the last 1 month he has had intermittent episodes of shortness of breath with associated chest tightness that he feels is related to his asthma.  The patient notes that this has been happening more often especially when he is outside.  He has been using his albuterol inhaler at home with relief of his symptoms but notes he has to use it at least once daily. The patient most recent episode of sob and chest tightness occurred today. He presented because he is out of his inhaler. He received albuterol nebulizer in the department with relief of his symptoms. Patient was seen here on 09/26/17 with complaints of 3 months of shortness of breath, difficulty taking a full breath, palpitations and tremors.  He had a reassuring workup and was discharged home with prednisone and albuterol for his symptoms.  He notes that the prednisone taper made his symptoms fully go away.  He reports that the difficulty taking a full breath, palpitation and tremors have not returned.  States he received a full workup by cardiology including stress test and echocardiogram that came back normal.  He was recently started daily on Montelukast on 1/28 (by [facility]) for suspect allergic rhinitis but notes that this medication has caused him unspecified side effects that made him stop taking. The patient denies sick contacts, fever, chills, chest pain, congestion, rhinorrhea, sore throat, neck stiffness, nausea, diaphoresis, orthopnea, doe, cough or hemoptysis. Denies risk factors for DVT/PE including exogenous testosterone use, recent surgery or travel,  trauma, immobilization, smoking, previous blood clot, cough, hemoptysis, cancer, lower extremity pain or swelling, or family history of bleeding/clotting disorder.   HPI  Past Medical History:  Diagnosis Date  . Anxiety   . Asthma   . Hypertension     Patient Active Problem List   Diagnosis Date Noted  . History of palpitations 09/09/2017  . Dyspnea on exertion 09/09/2017  . Anxiety about health 09/09/2017  . Suspected exposure to mold 09/09/2017  . History of asthma 12/23/2006    History reviewed. No pertinent surgical history.     Home Medications    Prior to Admission medications   Medication Sig Start Date End Date Taking? Authorizing Provider  albuterol (PROVENTIL HFA;VENTOLIN HFA) 108 (90 Base) MCG/ACT inhaler Inhale 1-2 puffs into the lungs every 6 (six) hours as needed for wheezing or shortness of breath. 08/23/17  Yes Teressa Lower, NP  GuaiFENesin (TUSSIN PO) Take 15 mLs by mouth daily as needed (chest congestion).   Yes [provider]  montelukast (SINGULAIR) 10 MG tablet Take 10 mg by mouth daily. 11/22/17 11/22/18 Yes [provider]  ALPRAZolam Prudy Feeler) 0.25 MG tablet Take 1 tablet (0.25 mg total) by mouth 3 (three) times daily as needed for anxiety. Patient not taking: Reported on 12/07/2017 09/26/17   Loren Racer, MD  busPIRone (BUSPAR) 5 MG tablet Take 1 tablet (5 mg total) by mouth 2 (two) times daily as needed. Patient not taking: Reported on 12/07/2017 09/24/17   Elvina Sidle, MD  PARoxetine (PAXIL) 20 MG tablet Take 1 tablet (20 mg total) by mouth at bedtime. Patient  not taking: Reported on 12/07/2017 09/22/17   Elvina SidleLauenstein, Kurt, MD  predniSONE (DELTASONE) 20 MG tablet 3 tabs po day one, then 2 po daily x 4 days Patient not taking: Reported on 12/07/2017 09/27/17   Loren RacerYelverton, David, MD    Family History Family History  Problem Relation Age of Onset  . Hypertension Other   . Diabetes Other   . Asthma Father     Social  History Social History   Tobacco Use  . Smoking status: Never Smoker  . Smokeless tobacco: Never Used  Substance Use Topics  . Alcohol use: No    Frequency: Never  . Drug use: No     Allergies   Patient has no known allergies.   Review of Systems Review of Systems  All other systems reviewed and are negative.    Physical Exam Updated Vital Signs BP 130/89   Pulse 74   Temp 97.6 F (36.4 C) (Oral)   Resp (!) 22   Ht 5\' 7"  (1.702 m)   Wt 74.8 kg (165 lb)   SpO2 100%   BMI 25.84 kg/m   Physical Exam  Constitutional: He appears well-developed and well-nourished.  HENT:  Head: Normocephalic and atraumatic.  Right Ear: Tympanic membrane and external ear normal.  Left Ear: Tympanic membrane and external ear normal.  Nose: Mucosal edema present.  Mouth/Throat: Uvula is midline, oropharynx is clear and moist and mucous membranes are normal. No tonsillar exudate.  The patient has normal phonation and is in control of secretions. No stridor.  Midline uvula without edema. Soft palate rises symmetrically.  No tonsillar erythema or exudates. No PTA. Cobblestoning. Tongue protrusion is normal. No trismus. No creptius on neck palpation and patient has good dentition. No gingival erythema or fluctuance noted. Mucus membranes moist.   Eyes: Pupils are equal, round, and reactive to light. Right eye exhibits no discharge. Left eye exhibits no discharge. No scleral icterus.  Allergic shiners noted  Neck: Trachea normal. Neck supple. No spinous process tenderness present. No neck rigidity. Normal range of motion present.  Cardiovascular: Normal rate, regular rhythm and intact distal pulses.  No murmur heard. Pulses:      Radial pulses are 2+ on the right side, and 2+ on the left side.       Dorsalis pedis pulses are 2+ on the right side, and 2+ on the left side.       Posterior tibial pulses are 2+ on the right side, and 2+ on the left side.  No lower extremity swelling or edema.  Calves symmetric in size bilaterally.  Pulmonary/Chest: Effort normal. No accessory muscle usage. No tachypnea. No respiratory distress. He has decreased breath sounds (greatest at bases b/l). He has wheezes (very faint end exp). He has no rhonchi. He has no rales. He exhibits no tenderness.  No increased work of breathing. No accessory muscle use. Patient is sitting upright, speaking in full sentences without difficulty   Abdominal: Soft. Bowel sounds are normal. There is no tenderness. There is no rebound and no guarding.  Musculoskeletal: He exhibits no edema.  Lymphadenopathy:    He has no cervical adenopathy.  Neurological: He is alert.  Skin: Skin is warm and dry. No rash noted. He is not diaphoretic.  Psychiatric: He has a normal mood and affect.  Nursing note and vitals reviewed.    ED Treatments / Results  Labs (all labs ordered are listed, but only abnormal results are displayed) Labs Reviewed - No data to display  EKG  EKG Interpretation None       Radiology Dg Chest 2 View  Result Date: 12/07/2017 CLINICAL DATA:  Short of breath EXAM: CHEST  2 VIEW COMPARISON:  09/26/2017 FINDINGS: The heart size and mediastinal contours are within normal limits. Both lungs are clear. The visualized skeletal structures are unremarkable. IMPRESSION: No active cardiopulmonary disease. Electronically Signed   By: Marlan Palau M.D.   On: 12/07/2017 17:42    Procedures Procedures (including critical care time)  Medications Ordered in ED Medications  ipratropium-albuterol (DUONEB) 0.5-2.5 (3) MG/3ML nebulizer solution 3 mL (not administered)  predniSONE (DELTASONE) tablet 60 mg (not administered)  albuterol (PROVENTIL) (2.5 MG/3ML) 0.083% nebulizer solution 5 mg (5 mg Nebulization Given 12/07/17 1411)     Initial Impression / Assessment and Plan / ED Course  I have reviewed the triage vital signs and the nursing notes.  Pertinent labs & imaging results that were available during  my care of the patient were reviewed by me and considered in my medical decision making (see chart for details).     32 y.o. male presenting with chest tightness, and shortness of breath relieved by albuterol inhaler that he feels is related to asthma, increasing over the last month. Seen for the same in Dec and discharged with prednisone with relief. Has seen cardiologist with negative stress test and echo in December. No chest pain, nausea, or diphoresis. Do not suspect CAD. Patient is wells and perc negative. Do not suspect PE at this time. Patient received breathing treatment prior to me evaluating the patient.  Still had some mild and expiratory wheezing and decreased breath sounds at the bilateral lower lobes but improvement of symptoms reported.  Chest x-ray without evidence of cardiopulmonary disease.  No cardiomegaly, pulmonary edema, pulmonary infiltrate or pleural effusion.  Will give DuoNeb and prednisone.  After DuoNeb treatment patient is symptom-free.  Wheezing has resolved.  Feels breathing is back to baseline.  Satting at 100% on room air.  Patient does have symptoms consistent with allergic rhinitis.  Was discharged with montelukast asked from previous provider but stopped due to "side effects".  Will give Zyrtec and Flonase at time of discharge.  Due to the patient's frequent exacerbations will start on low-dose inhaled corticosteroid trial.  Will discharge on prednisone taper and give refill of albuterol while in the department. I advised the patient to follow-up with PCP this week. Specific return precautions discussed. Time was given for all questions to be answered. The patient verbalized understanding and agreement with plan. The patient appears safe for discharge home.   Final Clinical Impressions(s) / ED Diagnoses   Final diagnoses:  Exacerbation of asthma, unspecified asthma severity, unspecified whether persistent    ED Discharge Orders        Ordered    cetirizine  (ZYRTEC) 10 MG tablet  Daily     12/07/17 1845    fluticasone (FLONASE) 50 MCG/ACT nasal spray  Daily     12/07/17 1845    predniSONE (DELTASONE) 20 MG tablet  Daily with breakfast     12/07/17 1845    fluticasone (FLOVENT DISKUS) 50 MCG/BLIST diskus inhaler  2 times daily     12/07/17 1845       Princella Pellegrini 12/07/17 [place] Scrape, MD 12/08/17 [phone]

## 2017-12-07 NOTE — Discharge Instructions (Signed)
You were seen here today for symptoms related to your known Asthma.   Please follow-up with your doctor in regards to your asthma exacerbation in the next 3 days for further evaluation and management of your asthma. Read the instructions below to learn more about factors that can trigger asthma. Use your albuterol inhaler as directed. Your more than welcome to return to the emergency department if you become short of breath, your albuterol inhaler does not relieve symptoms, you are having chest pain associated with difficulty breathing, or any symptoms that are concerning to you.    IDENTIFY AND CONTROL FACTORS THAT MAKE YOUR ASTHMA WORSE A number of common things can set off or make your asthma symptoms worse (asthma triggers). Keep track of your asthma symptoms for several weeks, detailing all the environmental and emotional factors that are linked with your asthma. When you have an asthma attack, go back to your asthma diary to see which factor, or combination of factors, might have contributed to it. Once you know what these factors are, you can take steps to control many of them.  Allergies: If you have allergies and asthma, it is important to take asthma prevention steps at home. Asthma attacks (worsening of asthma symptoms) can be triggered by allergies, which can cause temporary increased inflammation of your airways. Minimizing contact with the substance to which you are allergic will help prevent an asthma attack. Animal Dander:  Some people are allergic to the flakes of skin or dried saliva from animals with fur or feathers. Keep these pets out of your home.  If you can't keep a pet outdoors, keep the pet out of your bedroom and other sleeping areas at all times, and keep the door closed.  Remove carpets and furniture covered with cloth from your home. If that is not possible, keep the pet away from fabric-covered furniture and carpets.  Dust Mites: Many people with asthma are allergic to  dust mites. Dust mites are tiny bugs that are found in every home, in mattresses, pillows, carpets, fabric-covered furniture, bedcovers, clothes, stuffed toys, fabric, and other fabric-covered items.  Cover your mattress in a special dust-proof cover.  Cover your pillow in a special dust-proof cover, or wash the pillow each week in hot water. Water must be hotter than 130 F to kill dust mites. Cold or warm water used with detergent and bleach can also be effective.  Wash the sheets and blankets on your bed each week in hot water.  Try not to sleep or lie on cloth-covered cushions.  Call ahead when traveling and ask for a smoke-free hotel room. Bring your own bedding and pillows, in case the hotel only supplies feather pillows and down comforters, which may contain dust mites and cause asthma symptoms.  Remove carpets from your bedroom and those laid on concrete, if you can.  Keep stuffed toys out of the bed, or wash the toys weekly in hot water or cooler water with detergent and bleach.  Cockroaches: Many people with asthma are allergic to the droppings and remains of cockroaches.  Keep food and garbage in closed containers. Never leave food out.  Use poison baits, traps, powders, gels, or paste (for example, boric acid).  If a spray is used to kill cockroaches, stay out of the room until the odor goes away.  Indoor Mold: Fix leaky faucets, pipes, or other sources of water that have mold around them.  Clean moldy surfaces with a cleaner that has bleach in it.  Pollen and Outdoor Mold: When pollen or mold spore counts are high, try to keep your windows closed.  Stay indoors with windows closed from late morning to afternoon, if you can. Pollen and some mold spore counts are highest at that time.  Ask your caregiver whether you need to take or increase anti-inflammatory medicine before your allergy season starts.  Irritants:  Tobacco smoke is an irritant. If you smoke, ask your caregiver how you  can quit. Ask family members to quit smoking, too. Do not allow smoking in your home or car.  If possible, do not use a wood-burning stove, kerosene heater, or fireplace. Minimize exposure to all sources of smoke, including incense, candles, fires, and fireworks.  Try to stay away from strong odors and sprays, such as perfume, talcum powder, hair spray, and paints.  Decrease humidity in your home and use an indoor air cleaning device. Reduce indoor humidity to below 60 percent. Dehumidifiers or central air conditioners can do this.  Try to have someone else vacuum for you once or twice a week, if you can. Stay out of rooms while they are being vacuumed and for a short while afterward.  If you vacuum, use a dust mask from a hardware store, a double-layered or microfilter vacuum cleaner bag, or a vacuum cleaner with a HEPA filter.  Sulfites in foods and beverages can be irritants. Do not drink beer or wine, or eat dried fruit, processed potatoes, or shrimp if they cause asthma symptoms.  Cold air can trigger an asthma attack. Cover your nose and mouth with a scarf on cold or windy days.  Several health conditions can make asthma more difficult to manage, including runny nose, sinus infections, reflux disease, psychological stress, and sleep apnea. Your caregiver will treat these conditions, as well.  Avoid close contact with people who have a cold or the flu, since your asthma symptoms may get worse if you catch the infection from them. Wash your hands thoroughly after touching items that may have been handled by people with a respiratory infection.  Get a flu shot every year to protect against the flu virus, which often makes asthma worse for days or weeks. Also get a pneumonia shot once every five to 10 years.  Drugs: Aspirin and other painkillers can cause asthma attacks. 10% to 20% of people with asthma have sensitivity to aspirin or a group of painkillers called non-steroidal anti-inflammatory drugs  (NSAIDS), such as ibuprofen and naproxen. These drugs are used to treat pain and reduce fevers. Asthma attacks caused by any of these medicines can be severe and even fatal. These drugs must be avoided in people who have known aspirin sensitive asthma. Products with acetaminophen are considered safe for people who have asthma. It is important that people with aspirin sensitivity read labels of all over-the-counter drugs used to treat pain, colds, coughs, and fever.  Beta blockers and ACE inhibitors are other drugs which you should discuss with your caregiver, in relation to your asthma.   EXERCISE  If you have exercise-induced asthma, or are planning vigorous exercise, or exercise in cold, humid, or dry environments, prevent exercise-induced asthma by following your caregiver's advice regarding asthma treatment before exercising. Additional Information:  Your vital signs today were: BP 130/89    Pulse 74    Temp 97.6 F (36.4 C) (Oral)    Resp (!) 22    Ht 5\' 7"  (1.702 m)    Wt 74.8 kg (165 lb)    SpO2 100%  BMI 25.84 kg/m  If your blood pressure (BP) was elevated above 135/85 this visit, please have this repeated by your doctor within one month. ---------------

## 2017-12-07 NOTE — ED Triage Notes (Signed)
Patient states he was seen a month ago and was given Prednisone for congestion. Patient states that helped, but now he is having SOB again that started today. Patient denies any cough. Patient states that he has used an albuterol inhaler with some relief.

## 2017-12-09 NOTE — Telephone Encounter (Signed)
Left message to call back  

## 2017-12-16 ENCOUNTER — Emergency Department (HOSPITAL_COMMUNITY)
Admission: EM | Admit: 2017-12-16 | Discharge: 2017-12-16 | Disposition: A | Discharge status: Home / Self Care | Payer: Self-pay | Attending: Emergency Medicine | Admitting: Emergency Medicine

## 2017-12-16 ENCOUNTER — Emergency Department (HOSPITAL_COMMUNITY): Payer: Self-pay

## 2017-12-16 ENCOUNTER — Encounter (HOSPITAL_COMMUNITY): Payer: Self-pay | Admitting: Emergency Medicine

## 2017-12-16 ENCOUNTER — Other Ambulatory Visit: Payer: Self-pay

## 2017-12-16 DIAGNOSIS — I10 Essential (primary) hypertension: Secondary | ICD-10-CM | POA: Insufficient documentation

## 2017-12-16 DIAGNOSIS — R0602 Shortness of breath: Secondary | ICD-10-CM | POA: Insufficient documentation

## 2017-12-16 MED ORDER — MOMETASONE FURO-FORMOTEROL FUM 100-5 MCG/ACT IN AERO
2.0000 | INHALATION_SPRAY | Freq: Two times a day (BID) | RESPIRATORY_TRACT | Status: AC
  Administered 2017-12-16: 2 via RESPIRATORY_TRACT
  Filled 2017-12-16: qty 8.8

## 2017-12-16 MED ORDER — ALBUTEROL SULFATE (2.5 MG/3ML) 0.083% IN NEBU
5.0000 mg | INHALATION_SOLUTION | Freq: Once | RESPIRATORY_TRACT | Status: AC
  Administered 2017-12-16: 5 mg via RESPIRATORY_TRACT
  Filled 2017-12-16: qty 6

## 2017-12-16 MED ORDER — IPRATROPIUM BROMIDE 0.02 % IN SOLN
0.5000 mg | Freq: Once | RESPIRATORY_TRACT | Status: AC
  Administered 2017-12-16: 0.5 mg via RESPIRATORY_TRACT
  Filled 2017-12-16: qty 2.5

## 2017-12-16 MED ORDER — PREDNISONE 20 MG PO TABS: 40.0000 mg | ORAL_TABLET | Freq: Every day | ORAL | 0 refills | Status: AC

## 2017-12-16 MED ORDER — PREDNISONE 20 MG PO TABS
60.0000 mg | ORAL_TABLET | Freq: Once | ORAL | Status: AC
  Administered 2017-12-16: 60 mg via ORAL
  Filled 2017-12-16: qty 3

## 2017-12-16 NOTE — ED Notes (Signed)
Pt is refusing EKG. Says that he has a heart doctor and does not need us to look at his heart. He is just concerned about him not being able to breath.

## 2017-12-16 NOTE — ED Provider Notes (Signed)
[facility]-EMERGENCY DEPT Provider Note   CSN: [id number] Arrival date & time: 12/16/17  1158     History   Chief Complaint Chief Complaint  Patient presents with  . Asthma    HPI Jose Hall is a 32 y.o. male with a history of asthma and anxiety who presents to the emergency department with a chief complaint of dyspnea.  The patient reports that he woke up this morning and was feeling short of breath and was wheezing so he treated his symptoms with 2 puffs of his albuterol inhaler with a spacer, and his symptoms resolved.  He reports that he was able to go back to sleep but when he woke up again he was feeling short of breath so he used his albuterol again with minimal improvement in his symptoms.  He reports that his dyspnea is worse with room to room ambulation and improved by sitting and resting.  He denies of fever, chills, chest pain, palpitations, leg swelling, diaphoresis, numbness, weakness, left arm or jaw pain.  He also states that he has not had a cough.  He was seen on January 28 at [facility] to get established with primary care and was prescribed Singulair, which he has been compliant with.  He states that he feels like it makes his symptoms worse.  He states that he has been seen several times in the emergency department for similar symptoms.  He states that he was seen in December and was discharged with a course of prednisone, and reports that his dyspnea fully resolved and he was symptom-free until he was seen in the ED on 2/12.  He states that he did not get any of the medications filled after this visit as he does not currently have medical insurance.  He states that he is can be unable to follow-up again with his primary care provider due to the cost.  He does not take a daily maintenance inhaler.   No past medical history or family history of DVT or PE.  Family history includes asthma in his father.  Past medical history the patient includes  childhood asthma.  The history is provided by the patient. No language interpreter was used.  Asthma  Associated symptoms include shortness of breath. Pertinent negatives include no chest pain and no abdominal pain.    Past Medical History:  Diagnosis Date  . Anxiety   . Asthma   . Hypertension     Patient Active Problem List   Diagnosis Date Noted  . History of palpitations 09/09/2017  . Dyspnea on exertion 09/09/2017  . Anxiety about health 09/09/2017  . Suspected exposure to mold 09/09/2017  . History of asthma 12/23/2006    History reviewed. No pertinent surgical history.     Home Medications    Prior to Admission medications   Medication Sig Start Date End Date Taking? Authorizing Provider  albuterol (PROVENTIL HFA;VENTOLIN HFA) 108 (90 Base) MCG/ACT inhaler Inhale 1-2 puffs into the lungs every 6 (six) hours as needed for wheezing or shortness of breath. 08/23/17  Yes Teressa Lower, NP  cetirizine (ZYRTEC) 10 MG tablet Take 1 tablet (10 mg total) by mouth daily. 12/07/17  Yes Maczis, Elmer Sow, PA-C  fluticasone (FLONASE) 50 MCG/ACT nasal spray Place 2 sprays into both nostrils daily. 12/07/17  Yes Maczis, Elmer Sow, PA-C  fluticasone (FLOVENT DISKUS) 50 MCG/BLIST diskus inhaler Inhale 1 puff into the lungs 2 (two) times daily. 12/07/17  Yes Maczis, Elmer Sow, PA-C  GuaiFENesin (TUSSIN  PO) Take 15 mLs by mouth daily as needed (chest congestion).   Yes [provider]  montelukast (SINGULAIR) 10 MG tablet Take 10 mg by mouth daily. 11/22/17 11/22/18 Yes [provider]  predniSONE (DELTASONE) 20 MG tablet Take 2 tablets (40 mg total) by mouth daily with breakfast for 5 days. 12/16/17 12/21/17  McDonald, Coral Else, PA-C    Family History Family History  Problem Relation Age of Onset  . Hypertension Other   . Diabetes Other   . Asthma Father     Social History Social History   Tobacco Use  . Smoking status: Never Smoker  . Smokeless tobacco: Never  Used  Substance Use Topics  . Alcohol use: No    Frequency: Never  . Drug use: No     Allergies   Patient has no known allergies.   Review of Systems Review of Systems  Constitutional: Negative for activity change, chills and fever.  Respiratory: Positive for chest tightness, shortness of breath and wheezing. Negative for cough.   Cardiovascular: Negative for chest pain, palpitations and leg swelling.  Gastrointestinal: Negative for abdominal pain, diarrhea and vomiting.  Musculoskeletal: Negative for back pain.  Skin: Negative for rash.  Neurological: Negative for dizziness and light-headedness.     Physical Exam Updated Vital Signs BP (!) 157/73   Pulse 97   Temp 98.1 F (36.7 C) (Oral)   Resp 20   SpO2 100%   Physical Exam  Constitutional: He appears well-developed and well-nourished. No distress.  HENT:  Head: Normocephalic.  Eyes: Conjunctivae are normal.  Neck: Normal range of motion. Neck supple. No JVD present.  Cardiovascular: Normal rate, regular rhythm, normal heart sounds and intact distal pulses. Exam reveals no gallop and no friction rub.  No murmur heard. Pulmonary/Chest: Effort normal and breath sounds normal. No stridor. No respiratory distress. He has no wheezes. He has no rales. He exhibits no tenderness.  Mild accessory muscle use bilaterally.  No retractions.  No nasal flaring.  No respiratory distress.  Lungs are clear to auscultation, however the patient has decreased air movement that is symmetric bilaterally.  Abdominal: Soft. He exhibits no distension and no mass. There is no tenderness. There is no rebound and no guarding. No hernia.  Neurological: He is alert.  Skin: Skin is warm and dry. Capillary refill takes less than 2 seconds. He is not diaphoretic.  Psychiatric: His behavior is normal.  Nursing note and vitals reviewed.    ED Treatments / Results  Labs (all labs ordered are listed, but only abnormal results are displayed) Labs  Reviewed - No data to display  EKG  EKG Interpretation None       Radiology Dg Chest 2 View  Result Date: 12/16/2017 CLINICAL DATA:  History of asthma. Shortness of breath, cough and chest tightness today. EXAM: CHEST  2 VIEW COMPARISON:  PA and lateral chest 12/07/2017, 09/26/2017 and 05/18/2004. FINDINGS: The lungs are clear. Heart size is normal. No pneumothorax or pleural fluid. No bony abnormality. IMPRESSION: Negative chest. Electronically Signed   By: Drusilla Kanner M.D.   On: 12/16/2017 12:43    Procedures Procedures (including critical care time)  Medications Ordered in ED Medications  albuterol (PROVENTIL) (2.5 MG/3ML) 0.083% nebulizer solution 5 mg (5 mg Nebulization Given 12/16/17 1226)  ipratropium (ATROVENT) nebulizer solution 0.5 mg (0.5 mg Nebulization Given 12/16/17 1227)  albuterol (PROVENTIL) (2.5 MG/3ML) 0.083% nebulizer solution 5 mg (5 mg Nebulization Given 12/16/17 1403)  mometasone-formoterol (DULERA) 100-5 MCG/ACT inhaler 2  puff (2 puffs Inhalation Given 12/16/17 1431)  predniSONE (DELTASONE) tablet 60 mg (60 mg Oral Given 12/16/17 1403)     Initial Impression / Assessment and Plan / ED Course  I have reviewed the triage vital signs and the nursing notes.  Pertinent labs & imaging results that were available during my care of the patient were reviewed by me and considered in my medical decision making (see chart for details).     32 year old male with a history of asthma and anxiety presenting with dyspnea and wheezing that began this morning.  Symptoms initially improved at home after using his albuterol nebulizer treatment.  He was given 1 albuterol and ipratropium treatment in the ED prior to my examination.  On examination, lungs were clear however he had decreased air movement bilaterally with accessory muscle use.;  SaO2 100% on room air with shallow waveform.  Repeated nebulizer treatment and accessory muscle use resolved and waveform improved on the  monitor.  He was ambulated on pulse ox and maintain an SaO2 of 100%.  He is mildly tachycardic, likely secondary to albuterol use.  Prednisone given in the ED.  Doubt PE, ACS, pericarditis or myocarditis, or pneumonia.  Chest x-ray is unremarkable.  Will discharge the patient to home with a burst of prednisone.  Since the patient has not been able to afford prescriptions for maintenance medications, a Dulera inhaler was provided to him in the ED.  Case management has scheduled the patient for follow-up with [facility] since he stated that he would be unable to see his new primary care physician again secondary to cost as he does not currently have medical insurance.  At this time, he reports that dyspnea has resolved.  He is hemodynamically stable and in no acute distress.  The patient is safe for discharge home at this time.  Final Clinical Impressions(s) / ED Diagnoses   Final diagnoses:  Shortness of breath    ED Discharge Orders        Ordered    predniSONE (DELTASONE) 20 MG tablet  Daily with breakfast     12/16/17 1510       McDonald, Mia A, PA-C 12/16/17 1522    Charlynne PanderYao, David Hsienta, MD 12/16/17 [phone]

## 2017-12-16 NOTE — Discharge Instructions (Signed)
Take 2 puffs of Dulera with your spacer in the morning and 2 puffs at night.  This is a maintenance inhaler and should be used daily even when you are not having symptoms.  Take 2 puffs of your albuterol inhaler every 4 hours as needed when you are short of breath.  Starting tomorrow, take 2 tablets of prednisone daily for the next 5 days.  Please call to make a follow-up appointment and is scheduled with primary care.   If you develop new or worsening symptoms, including difficulty breathing, chest pain with sweating, pain going down the arm, or pain in your jaw, feeling as if your throat is closing, dizziness or lightheadedness, pain or swelling in her calf, or other new concerning symptoms, please return to the emergency department for re-evaluation.

## 2017-12-16 NOTE — Care Management Note (Signed)
Case Management Note  CM consulted for no pcp and no ins.  On chart review CM noted pt [facility] and also [facility].  Spoke with Lilian KapurMcDonald, PA who states pt paid out-of-pocket to be [facility] and that he can't afford to go back for future follow up or the cost of his medications.  CM noted in chart that pt canceled an appointment [facility] due to not being able to pay.  CM placed the 3 clinic options and pharmacy/financial counseling information on AVS.  CM spoke with pt about the 3 clinic options, their locations, the process for pharmacy and financial counseling.  Pt acknowledged understanding and had no further questions.  Updated McDonald, PA.  No further CM needs noted at this time.

## 2017-12-16 NOTE — ED Triage Notes (Signed)
Pt verbalizes "working with a doctor for my asthma but it is not getting better."

## 2017-12-21 ENCOUNTER — Encounter: Payer: Self-pay | Admitting: Internal Medicine

## 2017-12-21 ENCOUNTER — Ambulatory Visit (INDEPENDENT_AMBULATORY_CARE_PROVIDER_SITE_OTHER): Payer: Self-pay | Admitting: Internal Medicine

## 2017-12-21 VITALS — BP 138/88 | HR 102 | Temp 98.6°F | Resp 18 | Ht 67.0 in | Wt 160.0 lb

## 2017-12-21 DIAGNOSIS — J4540 Moderate persistent asthma, uncomplicated: Secondary | ICD-10-CM

## 2017-12-21 DIAGNOSIS — Z23 Encounter for immunization: Secondary | ICD-10-CM

## 2017-12-21 MED ORDER — FLUTICASONE PROPIONATE 50 MCG/ACT NA SUSP: 2.0000 | Freq: Every day | NASAL | 6 refills | Status: DC

## 2017-12-21 MED ORDER — BECLOMETHASONE DIPROP HFA 80 MCG/ACT IN AERB: 2.0000 | INHALATION_SPRAY | Freq: Two times a day (BID) | RESPIRATORY_TRACT | 3 refills | Status: DC

## 2017-12-21 MED ORDER — CETIRIZINE HCL 10 MG PO TABS: 10.0000 mg | ORAL_TABLET | Freq: Every day | ORAL | 11 refills | Status: DC

## 2017-12-21 MED ORDER — ALBUTEROL SULFATE HFA 108 (90 BASE) MCG/ACT IN AERS
2.0000 | INHALATION_SPRAY | Freq: Four times a day (QID) | RESPIRATORY_TRACT | 0 refills | Status: DC | PRN
Start: 2017-12-21 — End: 2018-01-08

## 2017-12-21 MED FILL — !VENTOLIN HFA INHALER: 108 (90 BAS | 25 days supply | Qty: 18 | Fill #0

## 2017-12-21 MED FILL — FLUTICASONE PROP 50 MCG SPR: 50 | 30 days supply | Qty: 16 | Fill #0

## 2017-12-21 MED FILL — QVAR REDIHALER 80 MCG/ACT A: 80 | 30 days supply | Qty: 11 | Fill #0

## 2017-12-21 MED FILL — ?CETIRIZINE HCL 10 MG TABLE: 10 | 30 days supply | Qty: 30 | Fill #0

## 2017-12-21 NOTE — Patient Instructions (Signed)

## 2017-12-21 NOTE — Progress Notes (Signed)
Subjective:  Patient ID: Jose Hall, male    DOB: 1986-06-13  Age: 32 y.o. MRN: [id number]  CC: Establish Care and Asthma   HPI Jose Hall is a 32 y/o  Male with medical history of Asthma and Anxiety. Patient presents today to establish care.  Asthma: Patient reports diagnosis of asthma as a child. Reports difficulty over the last 1-2 years. Reports seeing a provider 1 year ago that placed him on several medications for treatment. He has no record of a pulmonary function test. Reports being intermittently adherent with medications. He c/o daily chest tightness with sob, relieved with Albuterol inhaler. Reports usage of Dulera twice a day, Albuterol 1-2 times per day, and Zyrtec, daily. Denies usage of Singulair; reports the medication makes his asthma worse. Denies excessive coughing or wheezing. Reports night symptoms with chest tightness and sob, 2-4 times per week. Symptoms are not worsened with exertional physical activity. Denies chest pain, palpitations, dizziness, lightheadedness, recurrent headaches, or near syncope. Denies smoking or using vapor inhalants.   Anxiety: Patient states that he was seen in the ER for a panic attacks. Reports controlled anxiety with removed life stressors. He was sent for an Echo to r/o cardiac complications. Echo (09/28/17) was unremarkable. He is not on chronic medications. Denies lack of energy, loss of interest in activities, or seclusion from others. Denies SI or HI. Reports a longterm significant other and family support.  Health Maintenance: Patient is due for flu vaccination and HIV screening.   Past Medical History:  Diagnosis Date  . Anxiety   . Asthma   . Hypertension    History reviewed. No pertinent surgical history. Patient Active Problem List   Diagnosis Date Noted  . History of palpitations 09/09/2017  . Dyspnea on exertion 09/09/2017  . Anxiety about health 09/09/2017  . Suspected exposure to mold 09/09/2017  . History of  asthma 12/23/2006   Social History   Socioeconomic History  . Marital status: Single    Spouse name: Not on file  . Number of children: Not on file  . Years of education: Not on file  . Highest education level: Not on file  Social Needs  . Financial resource strain: Not on file  . Food insecurity - worry: Not on file  . Food insecurity - inability: Not on file  . Transportation needs - medical: Not on file  . Transportation needs - non-medical: Not on file  Occupational History  . Not on file  Tobacco Use  . Smoking status: Never Smoker  . Smokeless tobacco: Never Used  Substance and Sexual Activity  . Alcohol use: No    Frequency: Never  . Drug use: No  . Sexual activity: Yes  Other Topics Concern  . Not on file  Social History Narrative   ** Merged History Encounter **       Outpatient Medications Prior to Visit  Medication Sig Dispense Refill  . predniSONE (DELTASONE) 20 MG tablet Take 2 tablets (40 mg total) by mouth daily with breakfast for 5 days. 10 tablet 0  . mometasone-formoterol (DULERA) 100-5 MCG/ACT AERO Inhale 2 puffs into the lungs 2 (two) times daily.    Marland Kitchen albuterol (PROVENTIL HFA;VENTOLIN HFA) 108 (90 Base) MCG/ACT inhaler Inhale 1-2 puffs into the lungs every 6 (six) hours as needed for wheezing or shortness of breath. (Patient not taking: Reported on 12/21/2017) 1 Inhaler 0  . cetirizine (ZYRTEC) 10 MG tablet Take 1 tablet (10 mg total) by mouth daily. (Patient  not taking: Reported on 12/21/2017) 30 tablet 0  . fluticasone (FLONASE) 50 MCG/ACT nasal spray Place 2 sprays into both nostrils daily. (Patient not taking: Reported on 12/21/2017) 16 g 0  . fluticasone (FLOVENT DISKUS) 50 MCG/BLIST diskus inhaler Inhale 1 puff into the lungs 2 (two) times daily. (Patient not taking: Reported on 12/21/2017) 1 Inhaler 0  . GuaiFENesin (TUSSIN PO) Take 15 mLs by mouth daily as needed (chest congestion).    . montelukast (SINGULAIR) 10 MG tablet Take 10 mg by mouth daily.      No facility-administered medications prior to visit.    No Known Allergies  ROS Review of Systems  Constitutional: Negative for activity change, appetite change, chills, fatigue, fever and unexpected weight change.  HENT: Negative for congestion, ear pain, postnasal drip, rhinorrhea, sinus pressure, sinus pain, sore throat, trouble swallowing and voice change.   Respiratory: Positive for chest tightness and shortness of breath. Negative for cough and wheezing.   Cardiovascular: Negative for chest pain and palpitations.  Gastrointestinal: Negative for abdominal pain, nausea and vomiting.  Neurological: Negative for dizziness, syncope, light-headedness and headaches.  Psychiatric/Behavioral: Negative for decreased concentration, dysphoric mood, sleep disturbance and suicidal ideas.  All other systems reviewed and are negative.  Objective:  BP 138/88 (BP Location: Left Arm, Patient Position: Sitting, Cuff Size: Normal)   Pulse (!) 102   Temp 98.6 F (37 C) (Oral)   Resp 18   Ht 5\' 7"  (1.702 m)   Wt 160 lb (72.6 kg)   SpO2 100%   BMI 25.06 kg/m   BP/Weight 12/21/2017 12/16/2017 12/07/2017  Systolic BP 138 157 126  Diastolic BP 88 73 70  Wt. (Lbs) 160 - 165  BMI 25.06 - 25.84   Physical Exam  Constitutional: He is oriented to person, place, and time. He appears well-developed and well-nourished. No distress.  HENT:  Head: Normocephalic and atraumatic.  Nose: Nose normal.  Mouth/Throat: Oropharynx is clear and moist.  Eyes: Conjunctivae and EOM are normal.  Neck: Normal range of motion. Neck supple.  Cardiovascular: Normal heart sounds. Exam reveals no gallop and no friction rub.  No murmur heard. Pulmonary/Chest: Effort normal and breath sounds normal. No respiratory distress. He has no wheezes. He exhibits no tenderness.  Abdominal: Soft. Bowel sounds are normal. He exhibits no distension. There is no tenderness.  Musculoskeletal: Normal range of motion.  Neurological: He  is alert and oriented to person, place, and time.  Skin: Skin is warm, dry and intact. No cyanosis. Nails show no clubbing.  Psychiatric: He has a normal mood and affect. His speech is normal and behavior is normal. Judgment and thought content normal. Cognition and memory are normal.  Noted poor eye contact during examiation  Nursing note and vitals reviewed.  Assessment & Plan:   1. Moderate persistent asthma without complication, uncontrolled - Patient presents with uncontrolled asthma on current medication regimen. Changed Dulera to Qvar. Patient instructed to resume his Flonase and Zyrtec.  - He is a self-pay. Instructed to seek application for financial support with the orange card.  - F/u in 3 months.  - Basic Metabolic Panel - CBC with Differential - Calcium, ionized - Magnesium - HIV antibody (with reflex)  - fluticasone (FLONASE) 50 MCG/ACT nasal spray; Place 2 sprays into both nostrils daily.  Dispense: 16 g; Refill: 6 - beclomethasone (QVAR REDIHALER) 80 MCG/ACT inhaler; Inhale 2 puffs into the lungs 2 (two) times daily.  Dispense: 1 Inhaler; Refill: 3 - cetirizine (ZYRTEC) 10 MG tablet;  Take 1 tablet (10 mg total) by mouth daily.  Dispense: 30 tablet; Refill: 11 - albuterol (PROVENTIL HFA;VENTOLIN HFA) 108 (90 Base) MCG/ACT inhaler; Inhale 2 puffs into the lungs every 6 (six) hours as needed for wheezing or shortness of breath.  Dispense: 1 Inhaler; Refill: 0  2. Health Maintenance - Influenza vaccination provided. - Immunization education provided.  -- Flu Vaccine QUAD 36+ mos IM  Meds ordered this encounter  Medications  . fluticasone (FLONASE) 50 MCG/ACT nasal spray    Sig: Place 2 sprays into both nostrils daily.    Dispense:  16 g    Refill:  6  . beclomethasone (QVAR REDIHALER) 80 MCG/ACT inhaler    Sig: Inhale 2 puffs into the lungs 2 (two) times daily.    Dispense:  1 Inhaler    Refill:  3  . cetirizine (ZYRTEC) 10 MG tablet    Sig: Take 1 tablet (10 mg  total) by mouth daily.    Dispense:  30 tablet    Refill:  11  . albuterol (PROVENTIL HFA;VENTOLIN HFA) 108 (90 Base) MCG/ACT inhaler    Sig: Inhale 2 puffs into the lungs every 6 (six) hours as needed for wheezing or shortness of breath.    Dispense:  1 Inhaler    Refill:  0    Follow-up: Return in about 3 months (around 03/20/2018).   Krystle H. Marice Potter, DNP-student   Evaluation and management procedures were performed by me with DNP Student in attendance, note written by DNP student under my supervision and collaboration. I have reviewed the note and I agree with the management and plan.   Jeanann Lewandowsky, MD, MHA, CPE, [facility] and [facility] [place], [place] [phone]   12/21/2017, 5:45 PM

## 2017-12-22 LAB — CBC WITH DIFFERENTIAL/PLATELET
BASOS ABS: 0 10*3/uL (ref 0.0–0.2)
Basos: 0 %
EOS (ABSOLUTE): 0 10*3/uL (ref 0.0–0.4)
EOS: 0 %
HEMATOCRIT: 46.9 % (ref 37.5–51.0)
HEMOGLOBIN: 16.5 g/dL (ref 13.0–17.7)
Immature Grans (Abs): 0 10*3/uL (ref 0.0–0.1)
Immature Granulocytes: 0 %
LYMPHS ABS: 1.8 10*3/uL (ref 0.7–3.1)
LYMPHS: 14 %
MCH: 29.1 pg (ref 26.6–33.0)
MCHC: 35.2 g/dL (ref 31.5–35.7)
MCV: 83 fL (ref 79–97)
MONOCYTES: 6 %
Monocytes Absolute: 0.8 10*3/uL (ref 0.1–0.9)
Neutrophils Absolute: 10.3 10*3/uL — ABNORMAL HIGH (ref 1.4–7.0)
Neutrophils: 80 %
Platelets: 305 10*3/uL (ref 150–379)
RBC: 5.67 x10E6/uL (ref 4.14–5.80)
RDW: 15.2 % (ref 12.3–15.4)
WBC: 12.9 10*3/uL — AB (ref 3.4–10.8)

## 2017-12-22 LAB — BASIC METABOLIC PANEL
BUN / CREAT RATIO: 20 (ref 9–20)
BUN: 22 mg/dL — AB (ref 6–20)
CALCIUM: 9.7 mg/dL (ref 8.7–10.2)
CHLORIDE: 103 mmol/L (ref 96–106)
CO2: 21 mmol/L (ref 20–29)
Creatinine, Ser: 1.1 mg/dL (ref 0.76–1.27)
GFR calc Af Amer: 102 mL/min/{1.73_m2} (ref >59)
GFR calc non Af Amer: 88 mL/min/{1.73_m2} (ref >59)
GLUCOSE: 80 mg/dL (ref 65–99)
Potassium: 3.6 mmol/L (ref 3.5–5.2)
Sodium: 140 mmol/L (ref 134–144)

## 2017-12-22 LAB — CALCIUM, IONIZED: CALCIUM ION: 5.5 mg/dL (ref 4.5–5.6)

## 2017-12-22 LAB — MAGNESIUM: MAGNESIUM: 2.2 mg/dL (ref 1.6–2.3)

## 2017-12-22 LAB — HIV ANTIBODY (ROUTINE TESTING W REFLEX): HIV SCREEN 4TH GENERATION: NONREACTIVE

## 2017-12-23 ENCOUNTER — Encounter (HOSPITAL_COMMUNITY): Payer: Self-pay | Admitting: *Deleted

## 2017-12-23 ENCOUNTER — Emergency Department (HOSPITAL_COMMUNITY)
Admission: EM | Admit: 2017-12-23 | Discharge: 2017-12-23 | Disposition: A | Discharge status: Home / Self Care | Payer: Self-pay | Attending: Emergency Medicine | Admitting: Emergency Medicine

## 2017-12-23 DIAGNOSIS — R0602 Shortness of breath: Secondary | ICD-10-CM

## 2017-12-23 DIAGNOSIS — I10 Essential (primary) hypertension: Secondary | ICD-10-CM | POA: Insufficient documentation

## 2017-12-23 DIAGNOSIS — R002 Palpitations: Secondary | ICD-10-CM | POA: Insufficient documentation

## 2017-12-23 MED ORDER — ALPRAZOLAM 0.25 MG PO TABS: 0.2500 mg | ORAL_TABLET | Freq: Every evening | ORAL | 0 refills | Status: DC | PRN

## 2017-12-23 NOTE — Discharge Instructions (Signed)
Your evaluated in the emergency department for shortness of breath.  Your oxygen and heart rate have all normalized.  Your lung sounds were clear and we do not feel you like you need a change in your asthma medication currently.  You were concerned that this may also be a symptom of your anxiety and we are prescribed short course of Xanax to try but you will need to follow-up with your primary care doctor for further evaluation.

## 2017-12-23 NOTE — ED Triage Notes (Signed)
Pt complains of shortness of breath and racing heart. Pt states he tried a new inhaler this morning and felt like his heart was racing after using it. Pt has hx of asthma.

## 2017-12-23 NOTE — ED Notes (Signed)
PT sat up state "it is starting to happen again" PT pulse went up 153 02  Stat 100%. PT refuse EKG state he feel he does not need one just need to be seen by MD

## 2017-12-23 NOTE — ED Provider Notes (Signed)
[facility]-EMERGENCY DEPT Provider Note   CSN: [id number] Arrival date & time: 12/23/17  0946     History   Chief Complaint Chief Complaint  Patient presents with  . Shortness of Breath    HPI Jose Hall is a 32 y.o. male.  He has a history of anxiety and a history of asthma.  He is presenting today with some increased shortness of breath and palpitations heart racing after using his inhaler he was last seen here about a week ago for same.  Here his sats have been 100% and is been slightly hypertensive.  His heart rate was borderline tachycardic.  He denies any drugs or smoking.  He states he used to be on Xanax for anxiety but stopped taking it because of he was feeling better.  He is hoping to restart on some Xanax.   The history is provided by the patient.  Shortness of Breath  This is a recurrent problem. The problem occurs frequently.The problem has been gradually improving. Associated symptoms include wheezing. Pertinent negatives include no fever, no headaches, no rhinorrhea, no sore throat, no neck pain, no syncope, no abdominal pain, no rash and no leg swelling. It is unknown what precipitated the problem. He has tried beta-agonist inhalers for the symptoms. The treatment provided mild relief. He has had prior ED visits. Associated medical issues include asthma. Associated medical issues do not include DVT or recent surgery.    Past Medical History:  Diagnosis Date  . Anxiety   . Asthma   . Hypertension     Patient Active Problem List   Diagnosis Date Noted  . History of palpitations 09/09/2017  . Dyspnea on exertion 09/09/2017  . Anxiety about health 09/09/2017  . Suspected exposure to mold 09/09/2017  . History of asthma 12/23/2006    History reviewed. No pertinent surgical history.     Home Medications    Prior to Admission medications   Medication Sig Start Date End Date Taking? Authorizing Provider  albuterol (PROVENTIL  HFA;VENTOLIN HFA) 108 (90 Base) MCG/ACT inhaler Inhale 1-2 puffs into the lungs every 6 (six) hours as needed for wheezing or shortness of breath. Patient not taking: Reported on 12/21/2017 08/23/17   Teressa Lower, NP  albuterol (PROVENTIL HFA;VENTOLIN HFA) 108 (90 Base) MCG/ACT inhaler Inhale 2 puffs into the lungs every 6 (six) hours as needed for wheezing or shortness of breath. 12/21/17   Quentin Angst, MD  beclomethasone (QVAR REDIHALER) 80 MCG/ACT inhaler Inhale 2 puffs into the lungs 2 (two) times daily. 12/21/17   Quentin Angst, MD  cetirizine (ZYRTEC) 10 MG tablet Take 1 tablet (10 mg total) by mouth daily. Patient not taking: Reported on 12/21/2017 12/07/17   Jacinto Halim, PA-C  cetirizine (ZYRTEC) 10 MG tablet Take 1 tablet (10 mg total) by mouth daily. 12/21/17   Quentin Angst, MD  fluticasone (FLONASE) 50 MCG/ACT nasal spray Place 2 sprays into both nostrils daily. Patient not taking: Reported on 12/21/2017 12/07/17   Jacinto Halim, PA-C  fluticasone [facility]) 50 MCG/ACT nasal spray Place 2 sprays into both nostrils daily. 12/21/17   Quentin Angst, MD  fluticasone (FLOVENT DISKUS) 50 MCG/BLIST diskus inhaler Inhale 1 puff into the lungs 2 (two) times daily. Patient not taking: Reported on 12/21/2017 12/07/17   Jacinto Halim, PA-C  GuaiFENesin (TUSSIN PO) Take 15 mLs by mouth daily as needed (chest congestion).    [provider]  montelukast (SINGULAIR) 10 MG tablet Take  10 mg by mouth daily. 11/22/17 11/22/18  [provider]    Family History Family History  Problem Relation Age of Onset  . Hypertension Other   . Diabetes Other   . Asthma Father     Social History Social History   Tobacco Use  . Smoking status: Never Smoker  . Smokeless tobacco: Never Used  Substance Use Topics  . Alcohol use: No    Frequency: Never  . Drug use: No     Allergies   Patient has no known allergies.   Review of Systems Review of  Systems  Constitutional: Negative for fever.  HENT: Negative for rhinorrhea and sore throat.   Respiratory: Positive for shortness of breath and wheezing.   Cardiovascular: Negative for leg swelling and syncope.  Gastrointestinal: Negative for abdominal pain.  Musculoskeletal: Negative for neck pain.  Skin: Negative for rash.  Neurological: Negative for headaches.     Physical Exam Updated Vital Signs BP (!) 148/95 (BP Location: Left Arm)   Pulse 90   Temp 98 F (36.7 C) (Oral)   Resp 20   SpO2 100%   Physical Exam  Constitutional: He appears well-developed and well-nourished.  HENT:  Head: Normocephalic and atraumatic.  Eyes: Conjunctivae are normal.  Neck: Neck supple.  Cardiovascular: Normal rate and normal heart sounds.  No murmur heard. Pulmonary/Chest: Effort normal and breath sounds normal. No accessory muscle usage. No respiratory distress. He exhibits no mass.  Abdominal: Soft. Bowel sounds are normal. He exhibits no mass. There is no tenderness.  Musculoskeletal: Normal range of motion.       Right lower leg: Normal. He exhibits no tenderness.       Left lower leg: Normal. He exhibits no tenderness.  Neurological: He is alert. GCS eye subscore is 4. GCS verbal subscore is 5. GCS motor subscore is 6.  Skin: Skin is warm and dry. Capillary refill takes less than 2 seconds.  Psychiatric: He has a normal mood and affect.  Nursing note and vitals reviewed.    ED Treatments / Results  Labs (all labs ordered are listed, but only abnormal results are displayed) Labs Reviewed - No data to display  EKG  EKG Interpretation None       Radiology No results found.  Procedures Procedures (including critical care time)  Medications Ordered in ED Medications - No data to display   Initial Impression / Assessment and Plan / ED Course  I have reviewed the triage vital signs and the nursing notes.  Pertinent labs & imaging results that were available during my  care of the patient were reviewed by me and considered in my medical decision making (see chart for details).      Final Clinical Impressions(s) / ED Diagnoses   Final diagnoses:  SOB (shortness of breath)    ED Discharge Orders        Ordered    ALPRAZolam (XANAX) 0.25 MG tablet  At bedtime PRN     12/23/17 1315       Terrilee FilesButler, Michael C, MD 12/25/17 1029

## 2018-01-03 ENCOUNTER — Ambulatory Visit: Payer: Self-pay | Attending: Internal Medicine

## 2018-01-05 ENCOUNTER — Other Ambulatory Visit: Payer: Self-pay

## 2018-01-05 ENCOUNTER — Telehealth: Payer: Self-pay

## 2018-01-05 ENCOUNTER — Emergency Department (HOSPITAL_COMMUNITY): Payer: Self-pay

## 2018-01-05 ENCOUNTER — Encounter (HOSPITAL_COMMUNITY): Payer: Self-pay

## 2018-01-05 DIAGNOSIS — Z5321 Procedure and treatment not carried out due to patient leaving prior to being seen by health care provider: Secondary | ICD-10-CM | POA: Insufficient documentation

## 2018-01-05 MED ORDER — ALBUTEROL SULFATE (2.5 MG/3ML) 0.083% IN NEBU
5.0000 mg | INHALATION_SOLUTION | Freq: Once | RESPIRATORY_TRACT | Status: AC
  Administered 2018-01-05: 5 mg via RESPIRATORY_TRACT
  Filled 2018-01-05: qty 6

## 2018-01-05 NOTE — Telephone Encounter (Signed)
Patient was advised to go to urgent or ED if he feels that his sob is worsen. Patient states that he has been taking Qvar but not his Ventolin. Patient states that he will call first thing in morning for appointment.

## 2018-01-05 NOTE — ED Triage Notes (Signed)
Pt reports SOB. He states that it started tonight when he laid down for bed. He has a history of asthma. He reports that he was recently put on Q Var and he does not think its working. A&Ox4. Ambulatory.

## 2018-01-06 ENCOUNTER — Emergency Department (HOSPITAL_COMMUNITY)
Admission: EM | Admit: 2018-01-06 | Discharge: 2018-01-06 | Discharge status: Against Medical Advice | Payer: Self-pay | Attending: Emergency Medicine | Admitting: Emergency Medicine

## 2018-01-06 NOTE — ED Notes (Signed)
Pt called x1 for vitals reassessment, no answer. [employer]

## 2018-01-07 ENCOUNTER — Emergency Department (HOSPITAL_COMMUNITY): Payer: Self-pay

## 2018-01-07 ENCOUNTER — Encounter (HOSPITAL_COMMUNITY): Payer: Self-pay | Admitting: Emergency Medicine

## 2018-01-07 ENCOUNTER — Other Ambulatory Visit: Payer: Self-pay

## 2018-01-07 DIAGNOSIS — F419 Anxiety disorder, unspecified: Secondary | ICD-10-CM | POA: Insufficient documentation

## 2018-01-07 DIAGNOSIS — I10 Essential (primary) hypertension: Secondary | ICD-10-CM | POA: Insufficient documentation

## 2018-01-07 DIAGNOSIS — Z79899 Other long term (current) drug therapy: Secondary | ICD-10-CM | POA: Insufficient documentation

## 2018-01-07 DIAGNOSIS — J4521 Mild intermittent asthma with (acute) exacerbation: Secondary | ICD-10-CM | POA: Insufficient documentation

## 2018-01-07 MED ORDER — ALBUTEROL SULFATE (2.5 MG/3ML) 0.083% IN NEBU
5.0000 mg | INHALATION_SOLUTION | Freq: Once | RESPIRATORY_TRACT | Status: AC
  Administered 2018-01-07: 5 mg via RESPIRATORY_TRACT
  Filled 2018-01-07: qty 6

## 2018-01-07 NOTE — ED Notes (Signed)
Pt called for triage, no response from lobby 

## 2018-01-07 NOTE — ED Notes (Signed)
Pt refused EKG, explained to pt the importance of and why we do it. The pt stated he understands but does not want to have an EKG

## 2018-01-07 NOTE — ED Triage Notes (Signed)
Patient complaining of SOB. Patient states that it has been getting bad back in February. Patient states it has not gotten any better.

## 2018-01-07 NOTE — ED Notes (Signed)
Patient explained each time he comes he needs an ekg and the importance of it. Patient refused.

## 2018-01-08 ENCOUNTER — Emergency Department (HOSPITAL_COMMUNITY)
Admission: EM | Admit: 2018-01-08 | Discharge: 2018-01-08 | Disposition: A | Discharge status: Home / Self Care | Payer: Self-pay | Attending: Emergency Medicine | Admitting: Emergency Medicine

## 2018-01-08 DIAGNOSIS — J4521 Mild intermittent asthma with (acute) exacerbation: Secondary | ICD-10-CM

## 2018-01-08 DIAGNOSIS — J4540 Moderate persistent asthma, uncomplicated: Secondary | ICD-10-CM

## 2018-01-08 DIAGNOSIS — F419 Anxiety disorder, unspecified: Secondary | ICD-10-CM

## 2018-01-08 LAB — CBC WITH DIFFERENTIAL/PLATELET
BASOS PCT: 0 %
Basophils Absolute: 0 10*3/uL (ref 0.0–0.1)
EOS ABS: 0.1 10*3/uL (ref 0.0–0.7)
Eosinophils Relative: 1 %
HEMATOCRIT: 40.6 % (ref 39.0–52.0)
HEMOGLOBIN: 15.2 g/dL (ref 13.0–17.0)
Lymphocytes Relative: 35 %
Lymphs Abs: 3.2 10*3/uL (ref 0.7–4.0)
MCH: 29.7 pg (ref 26.0–34.0)
MCHC: 37.4 g/dL — AB (ref 30.0–36.0)
MCV: 79.3 fL (ref 78.0–100.0)
MONOS PCT: 8 %
Monocytes Absolute: 0.7 10*3/uL (ref 0.1–1.0)
NEUTROS ABS: 5.2 10*3/uL (ref 1.7–7.7)
NEUTROS PCT: 56 %
Platelets: 261 10*3/uL (ref 150–400)
RBC: 5.12 MIL/uL (ref 4.22–5.81)
RDW: 13.6 % (ref 11.5–15.5)
WBC: 9.2 10*3/uL (ref 4.0–10.5)

## 2018-01-08 LAB — BASIC METABOLIC PANEL
ANION GAP: 11 (ref 5–15)
BUN: 11 mg/dL (ref 6–20)
CHLORIDE: 107 mmol/L (ref 101–111)
CO2: 22 mmol/L (ref 22–32)
CREATININE: 1.06 mg/dL (ref 0.61–1.24)
Calcium: 9.2 mg/dL (ref 8.9–10.3)
GFR calc non Af Amer: >60 mL/min (ref >60)
Glucose, Bld: 92 mg/dL (ref 65–99)
POTASSIUM: 3.4 mmol/L — AB (ref 3.5–5.1)
SODIUM: 140 mmol/L (ref 135–145)

## 2018-01-08 LAB — I-STAT TROPONIN, ED: TROPONIN I, POC: 0 ng/mL (ref 0.00–0.08)

## 2018-01-08 LAB — D-DIMER, QUANTITATIVE: D-Dimer, Quant: <0.27 ug/mL-FEU (ref 0.00–0.50)

## 2018-01-08 MED ORDER — BECLOMETHASONE DIPROP HFA 80 MCG/ACT IN AERB: 2.0000 | INHALATION_SPRAY | Freq: Two times a day (BID) | RESPIRATORY_TRACT | 0 refills | Status: DC

## 2018-01-08 MED ORDER — PREDNISONE 20 MG PO TABS: ORAL_TABLET | ORAL | 0 refills | Status: DC

## 2018-01-08 MED ORDER — ALPRAZOLAM 0.5 MG PO TABS
0.5000 mg | ORAL_TABLET | Freq: Once | ORAL | Status: DC
  Filled 2018-01-08: qty 1

## 2018-01-08 MED ORDER — ALBUTEROL SULFATE (2.5 MG/3ML) 0.083% IN NEBU
2.5000 mg | INHALATION_SOLUTION | Freq: Once | RESPIRATORY_TRACT | Status: AC
  Administered 2018-01-08: 2.5 mg via RESPIRATORY_TRACT
  Filled 2018-01-08: qty 3

## 2018-01-08 MED ORDER — ALBUTEROL SULFATE HFA 108 (90 BASE) MCG/ACT IN AERS: 1.0000 | INHALATION_SPRAY | Freq: Four times a day (QID) | RESPIRATORY_TRACT | 0 refills | Status: DC | PRN

## 2018-01-08 MED ORDER — PREDNISONE 20 MG PO TABS
40.0000 mg | ORAL_TABLET | Freq: Once | ORAL | Status: AC
  Administered 2018-01-08: 40 mg via ORAL
  Filled 2018-01-08: qty 2

## 2018-01-08 NOTE — ED Provider Notes (Signed)
[facility]-EMERGENCY DEPT Provider Note   CSN: [id number] Arrival date & time: 01/07/18  1941     History   Chief Complaint Chief Complaint  Patient presents with  . Shortness of Breath    HPI Jose Hall is a 32 y.o. male with PMH/o Asthma, Anxiety, HTN who presents for evaluation of SOB. Patient reports that this has been on going issue for the last 3 months. He states that he will have intermittent episodes where he finds it hard to breathe. He states that these episodes are not brought on by exertion or deep inspiration.  He reports that these episodes occur both at rest and when he he denies any triggering or inciting event.  Patient reports only has these episodes, he has some chest tightness is active.  Denies any pain with deep inspiration.  Patient states he was diagnosed with asthma and has followed up with his primary care doctor and has not had multiple ED visits for the same symptoms today.  He states that he was prescribed Qvar which he states he stopped taking because he did not like the way it was making him feel.  He reports that he occasionally uses albuterol inhaler.  Patient states that he has not had any cough, fever, nausea/vomiting, difficulty breathing.  Patient denies any steroid use, surgeries, immobilizations, long plane, car, train ride, history of DVTs or PEs.  The history is provided by the patient.    Past Medical History:  Diagnosis Date  . Anxiety   . Asthma   . Hypertension     Patient Active Problem List   Diagnosis Date Noted  . History of palpitations 09/09/2017  . Dyspnea on exertion 09/09/2017  . Anxiety about health 09/09/2017  . Suspected exposure to mold 09/09/2017  . History of asthma 12/23/2006    History reviewed. No pertinent surgical history.     Home Medications    Prior to Admission medications   Medication Sig Start Date End Date Taking? Authorizing Provider  ALPRAZolam (XANAX) 0.25 MG tablet Take  1 tablet (0.25 mg total) by mouth at bedtime as needed for anxiety. 12/23/17  Yes Terrilee Files, MD  cetirizine (ZYRTEC) 10 MG tablet Take 1 tablet (10 mg total) by mouth daily. 12/21/17  Yes Quentin Angst, MD  fluticasone (FLONASE) 50 MCG/ACT nasal spray Place 2 sprays into both nostrils daily. 12/21/17  Yes Quentin Angst, MD  albuterol (PROVENTIL HFA;VENTOLIN HFA) 108 (90 Base) MCG/ACT inhaler Inhale 1-2 puffs into the lungs every 6 (six) hours as needed for wheezing or shortness of breath. 01/08/18   Palumbo, April, MD  beclomethasone (QVAR REDIHALER) 80 MCG/ACT inhaler Inhale 2 puffs into the lungs 2 (two) times daily. 01/08/18   Palumbo, April, MD  fluticasone (FLOVENT DISKUS) 50 MCG/BLIST diskus inhaler Inhale 1 puff into the lungs 2 (two) times daily. Patient not taking: Reported on 12/23/2017 12/07/17   Jacinto Halim, PA-C  predniSONE (DELTASONE) 20 MG tablet 3 tabs po day one, then 2 po daily x 4 days 01/08/18   Nicanor Alcon, April, MD    Family History Family History  Problem Relation Age of Onset  . Hypertension Other   . Diabetes Other   . Asthma Father     Social History Social History   Tobacco Use  . Smoking status: Never Smoker  . Smokeless tobacco: Never Used  Substance Use Topics  . Alcohol use: No    Frequency: Never  . Drug use: No  Allergies   Patient has no known allergies.   Review of Systems Review of Systems  Constitutional: Negative for chills and fever.  Eyes: Negative for visual disturbance.  Respiratory: Positive for chest tightness and shortness of breath. Negative for cough.   Cardiovascular: Negative for chest pain and leg swelling.  Gastrointestinal: Negative for abdominal pain, diarrhea, nausea and vomiting.  Genitourinary: Negative for dysuria and hematuria.  Musculoskeletal: Negative for back pain and neck pain.  Neurological: Negative for dizziness, weakness, numbness and headaches.  Psychiatric/Behavioral: Negative for  confusion.  All other systems reviewed and are negative.    Physical Exam Updated Vital Signs BP (!) 146/103   Pulse 61   Temp 98.3 F (36.8 C)   Resp 15   Ht 5\' 7"  (1.702 m)   Wt 72.6 kg (160 lb)   SpO2 100%   BMI 25.06 kg/m   Physical Exam  Constitutional: He is oriented to person, place, and time. He appears well-developed and well-nourished.  HENT:  Head: Normocephalic and atraumatic.  Mouth/Throat: Oropharynx is clear and moist and mucous membranes are normal.  Eyes: Conjunctivae, EOM and lids are normal. Pupils are equal, round, and reactive to light.  Neck: Full passive range of motion without pain.  Cardiovascular: Normal rate, regular rhythm, normal heart sounds and normal pulses. Exam reveals no gallop and no friction rub.  No murmur heard. Pulses:      Radial pulses are 2+ on the right side, and 2+ on the left side.       Dorsalis pedis pulses are 2+ on the right side, and 2+ on the left side.  Pulmonary/Chest: Effort normal and breath sounds normal. He has no wheezes.  No evidence of respiratory distress. Able to speak in full sentences without difficulty.\  Abdominal: Soft. Normal appearance. There is no tenderness. There is no rigidity and no guarding.  Musculoskeletal: Normal range of motion.  BLE are symmetric in appearance.  Neurological: He is alert and oriented to person, place, and time.  Skin: Skin is warm and dry. Capillary refill takes less than 2 seconds.  Psychiatric: His speech is normal. His mood appears anxious.  Nursing note and vitals reviewed.    ED Treatments / Results  Labs (all labs ordered are listed, but only abnormal results are displayed) Labs Reviewed  CBC WITH DIFFERENTIAL/PLATELET - Abnormal; Notable for the following components:      Result Value   MCHC 37.4 (*)    All other components within normal limits  BASIC METABOLIC PANEL - Abnormal; Notable for the following components:   Potassium 3.4 (*)    All other components  within normal limits  D-DIMER, QUANTITATIVE (NOT AT [facility])  I-STAT TROPONIN, ED    EKG  EKG Interpretation  Date/Time:  Saturday January 08 2018 06:38:00 EDT Ventricular Rate:  65 PR Interval:    QRS Duration: 88 QT Interval:  375 QTC Calculation: 390 R Axis:   112 Text Interpretation:  Sinus rhythm Confirmed by Palumbo, April ([id number]) on 01/08/2018 6:39:54 AM       Radiology Dg Chest 2 View  Result Date: 01/07/2018 CLINICAL DATA:  32 year old male with history of shortness of breath for 2 months. EXAM: CHEST - 2 VIEW COMPARISON:  Chest x-ray 01/05/2018. FINDINGS: Lung volumes are normal. No consolidative airspace disease. No pleural effusions. No pneumothorax. No pulmonary nodule or mass noted. Pulmonary vasculature and the cardiomediastinal silhouette are within normal limits. IMPRESSION: No radiographic evidence of acute cardiopulmonary disease. Electronically Signed   By:  Trudie Reed M.D.   On: 01/07/2018 21:19    Procedures Procedures (including critical care time)  Medications Ordered in ED Medications  albuterol (PROVENTIL) (2.5 MG/3ML) 0.083% nebulizer solution 5 mg (5 mg Nebulization Given 01/07/18 2045)  predniSONE (DELTASONE) tablet 40 mg (40 mg Oral Given 01/08/18 0158)  albuterol (PROVENTIL) (2.5 MG/3ML) 0.083% nebulizer solution 2.5 mg (2.5 mg Nebulization Given 01/08/18 0158)     Initial Impression / Assessment and Plan / ED Course  I have reviewed the triage vital signs and the nursing notes.  Pertinent labs & imaging results that were available during my care of the patient were reviewed by me and considered in my medical decision making (see chart for details).     32 y.o. M with past medical history of hypertension, asthma, anxiety who presents for evaluation of shortness of breath is been ongoing for last several months.  Seen by his PCP and in the ED multiple times for same thing.  He reports the episodes occur at random and with no triggering or inciting  event.  Does have some associated chest tightness but denies any chest pain. Patient is afebrile, non-toxic appearing, sitting comfortably on examination table. Vital signs reviewed and stable.  Patient is slightly hypertensive.  He does report he has a history of hypertension but is not taking any medication.  Denies any chest pain, headache, vision changes, numbness/weakness of his extremities at this time.  On exam, lungs are clear to auscultation.  I do not appreciate any wheezing but patient feels like he is wheezing.  Additionally throughout my exam patient will become very anxious and will almost gas to breathe.  During these times, lung sounds remain clear with good breath sounds throughout all lung fields.  I suspect that some of patient's symptoms may have an anxiety component to it as his symptoms seem to worsen when he gets more anxious.  Patient states that he felt like he was wheezing when he first came in here.  He was given a nebulizer treatment which he says is improved his symptoms.  On my exam, I do not appreciate wheezing but he still feels like he is having some residual wheezing.  Will give additional nebulizer treatment.  Chest x-ray reviewed.  Negative for any acute abnormality.  Patient initially refused EKG.  I explained the importance of EKG and he agreed to perform one but when tech went in to perform it, patient again refused.  Reevaluation after nebulizer treatment.  Lungs clear to auscultation.  Vitals are stable.  Patient is remained with O2 sats of greater than 95% on room air throughout duration of ED visit.  We will plan to ambulate patient.  Patient ambulated in the ED.  He initially had good O2 sats that were at 100%.  Tech reported that when patient started coming back to the room he became very anxious, tachycardic and oxygen went down to 91% on room air.  I would back into evaluate patient.  Patient was very anxious and was talking in full sentences but would  intermittently stop and gasp.  We will plan for additional evaluation.  EKG reviewed.  Troponin negative.  D-dimer negative.  CBC without any significant leukocytosis, anemia.  MP is unremarkable.  Discussed results with patient.  Vital signs are stable.  We will plan to give patient an inhaler since he states he has not been taking them to help with asthma exacerbation.  Instructed patient to follow-up with PCP in the next 24-48  hours for further evaluation. Patient had ample opportunity for questions and discussion. All patient's questions were answered with full understanding. Strict return precautions discussed. Patient expresses understanding and agreement to plan.    Final Clinical Impressions(s) / ED Diagnoses   Final diagnoses:  Mild intermittent asthma with exacerbation  Anxiety    ED Discharge Orders        Ordered    beclomethasone (QVAR REDIHALER) 80 MCG/ACT inhaler  2 times daily     01/08/18 0731    albuterol (PROVENTIL HFA;VENTOLIN HFA) 108 (90 Base) MCG/ACT inhaler  Every 6 hours PRN     01/08/18 0731    predniSONE (DELTASONE) 20 MG tablet     01/08/18 0731       Maxwell CaulLayden, Christpoher Sievers A, PA-C 01/08/18 1917    Palumbo, April, MD 01/10/18 2325

## 2018-01-08 NOTE — ED Notes (Signed)
Pt ambulated from the room to around the corner started at 100% and when got back to room 91%

## 2018-01-15 ENCOUNTER — Emergency Department (HOSPITAL_COMMUNITY)
Admission: EM | Admit: 2018-01-15 | Discharge: 2018-01-15 | Disposition: A | Discharge status: Home / Self Care | Payer: Self-pay | Attending: Emergency Medicine | Admitting: Emergency Medicine

## 2018-01-15 ENCOUNTER — Encounter (HOSPITAL_COMMUNITY): Payer: Self-pay

## 2018-01-15 DIAGNOSIS — Z5321 Procedure and treatment not carried out due to patient leaving prior to being seen by health care provider: Secondary | ICD-10-CM | POA: Insufficient documentation

## 2018-01-15 DIAGNOSIS — R0602 Shortness of breath: Secondary | ICD-10-CM | POA: Insufficient documentation

## 2018-01-15 MED ORDER — ALBUTEROL SULFATE (2.5 MG/3ML) 0.083% IN NEBU
5.0000 mg | INHALATION_SOLUTION | Freq: Once | RESPIRATORY_TRACT | Status: AC
  Administered 2018-01-15: 5 mg via RESPIRATORY_TRACT
  Filled 2018-01-15: qty 6

## 2018-01-15 NOTE — ED Triage Notes (Signed)
Pt states that he keeps waking up unable to breath, he tried his inhaler with little relief

## 2018-01-17 ENCOUNTER — Ambulatory Visit (INDEPENDENT_AMBULATORY_CARE_PROVIDER_SITE_OTHER): Payer: Self-pay | Admitting: Family Medicine

## 2018-01-17 ENCOUNTER — Encounter: Payer: Self-pay | Admitting: Family Medicine

## 2018-01-17 VITALS — BP 136/82 | HR 82 | Temp 98.6°F | Resp 18 | Ht 67.0 in | Wt 159.0 lb

## 2018-01-17 DIAGNOSIS — J4540 Moderate persistent asthma, uncomplicated: Secondary | ICD-10-CM

## 2018-01-17 DIAGNOSIS — J301 Allergic rhinitis due to pollen: Secondary | ICD-10-CM

## 2018-01-17 MED ORDER — ALBUTEROL SULFATE (2.5 MG/3ML) 0.083% IN NEBU
2.5000 mg | INHALATION_SOLUTION | Freq: Once | RESPIRATORY_TRACT | Status: AC
Start: 2018-01-17 — End: 2018-01-17
  Administered 2018-01-17: 2.5 mg via RESPIRATORY_TRACT

## 2018-01-17 MED ORDER — IPRATROPIUM BROMIDE 0.02 % IN SOLN
0.5000 mg | Freq: Once | RESPIRATORY_TRACT | Status: AC
  Administered 2018-01-17: 0.5 mg via RESPIRATORY_TRACT

## 2018-01-17 MED ORDER — BECLOMETHASONE DIPROP HFA 80 MCG/ACT IN AERB: 2.0000 | INHALATION_SPRAY | Freq: Two times a day (BID) | RESPIRATORY_TRACT | 5 refills | Status: DC

## 2018-01-17 MED ORDER — MONTELUKAST SODIUM 10 MG PO TABS: 10.0000 mg | ORAL_TABLET | Freq: Every day | ORAL | 11 refills | Status: DC

## 2018-01-17 NOTE — Patient Instructions (Signed)
Will continue Qvar nebulizer 2 puffs twice daily as prescribed.  Recommend only using albuterol during periods of wheezing, shortness of breath, and/or persistent cough.  Also recommend that we start a trial of Singulair 10 mg at bedtime. For allergic rhinitis continue cetirizine 10 mg daily. In the event of increased asthma exacerbations, you may warrant a referral to pulmonology.    The patient was given clear instructions to go to ER or return to medical center if symptoms do not improve, worsen or new problems develop. The patient verbalized understanding.

## 2018-01-17 NOTE — Progress Notes (Signed)
Subjective:    Patient ID: Jose Hall, male    DOB: January 11, 1986, 32 y.o.   MRN: [id number]  HPI A 32 year old male with a history of asthma and anxiety presents for follow-up.  Patient states that he has been evaluated in the emergency department on several occasions for this problem.  He endorses periodic shortness of breath and typically experiences panic attacks with shortness of breath.  Patient states that last asthma exacerbation was on 01/15/2018.  Patient was treated with oral steroids and nebulizer.  He says that he is awakened by asthma symptoms 2 nights per week.  He is only been using Qvar inhaler daily and mostly uses albuterol inhaler.  Patient does not take Singulair, he states "it makes me feel funny when I take it during the day".  He currently endorses chest tightness.  He says that he keeps having to take deep breaths while sitting.  Patient has a history of allergic rhinitis.  He typically takes cetirizine 10 mg daily for symptoms.  He currently denies fever, cough, fatigue, nausea, vomiting, or diarrhea.  Patient also experiences periodic anxiety with asthma exacerbation.  He typically takes Xanax 0.25 mg during periods of anxiety or panic.  He denies suicidal or homicidal ideations at present. Past Medical History:  Diagnosis Date  . Anxiety   . Asthma   . Hypertension    Social History   Socioeconomic History  . Marital status: Single    Spouse name: Not on file  . Number of children: Not on file  . Years of education: Not on file  . Highest education level: Not on file  Occupational History  . Not on file  Social Needs  . Financial resource strain: Not on file  . Food insecurity:    Worry: Not on file    Inability: Not on file  . Transportation needs:    Medical: Not on file    Non-medical: Not on file  Tobacco Use  . Smoking status: Never Smoker  . Smokeless tobacco: Never Used  Substance and Sexual Activity  . Alcohol use: No    Frequency: Never  .  Drug use: No  . Sexual activity: Yes  Lifestyle  . Physical activity:    Days per week: Not on file    Minutes per session: Not on file  . Stress: Not on file  Relationships  . Social connections:    Talks on phone: Not on file    Gets together: Not on file    Attends religious service: Not on file    Active member of club or organization: Not on file    Attends meetings of clubs or organizations: Not on file    Relationship status: Not on file  . Intimate partner violence:    Fear of current or ex partner: Not on file    Emotionally abused: Not on file    Physically abused: Not on file    Forced sexual activity: Not on file  Other Topics Concern  . Not on file  Social History Narrative   ** Merged History Encounter **       Immunization History  Administered Date(s) Administered  . Influenza,inj,Quad PF,6+ Mos 12/21/2017  . Td 01/29/2005     Review of Systems  Constitutional: Negative.   HENT: Negative.   Eyes: Negative.  Negative for photophobia and visual disturbance.  Respiratory: Positive for chest tightness. Negative for shortness of breath and wheezing.   Cardiovascular: Negative.  Negative for chest  pain, palpitations and leg swelling.  Gastrointestinal: Negative.   Endocrine: Negative.   Genitourinary: Negative.   Musculoskeletal: Negative.   Skin: Negative.   Allergic/Immunologic: Negative.   Neurological: Negative.   Hematological: Negative.   Psychiatric/Behavioral: Negative.        Objective:   Physical Exam  Constitutional: He is oriented to person, place, and time. He appears well-developed.  HENT:  Head: Normocephalic and atraumatic.  Right Ear: External ear normal.  Cardiovascular: Normal rate, regular rhythm and normal heart sounds.  Pulmonary/Chest: Effort normal.  Abdominal: Soft. Bowel sounds are normal.  Musculoskeletal: Normal range of motion.  Neurological: He is alert and oriented to person, place, and time. He has normal reflexes.   Skin: Skin is warm and dry.  Psychiatric: He has a normal mood and affect. His behavior is normal. Judgment and thought content normal.       BP 136/82 (BP Location: Left Arm, Patient Position: Sitting, Cuff Size: Normal)   Pulse 82   Temp 98.6 F (37 C) (Oral)   Resp 18   Ht 5\' 7"  (1.702 m)   Wt 159 lb (72.1 kg)   SpO2 100%   BMI 24.90 kg/m  Assessment & Plan:  1. Moderate persistent asthma without complication Patient and I discussed asthma treatment at length.  Patient is currently not using Qvar properly.  Reminded patient to take Qvar inhaler 2 puffs twice daily.  He was also reminded that albuterol as a rescue inhaler and only needs to be used during times of wheezing, persistent cough, and/or wheezing.  Patient expressed understanding.  Patient may benefit from pulmonology workup.  Recommend that he continues to follow-up for asthma consistently. - beclomethasone (QVAR REDIHALER) 80 MCG/ACT inhaler; Inhale 2 puffs into the lungs 2 (two) times daily.  Dispense: 1 Inhaler; Refill: 5 - montelukast (SINGULAIR) 10 MG tablet; Take 1 tablet (10 mg total) by mouth at bedtime.  Dispense: 30 tablet; Refill: 11 - albuterol (PROVENTIL) (2.5 MG/3ML) 0.083% nebulizer solution 2.5 mg - ipratropium (ATROVENT) nebulizer solution 0.5 mg  2. Seasonal allergic rhinitis due to pollen Recommend that he continue cetirizine daily for seasonal allergies.   RTC: 3 months for asthma   Nolon Nations  MSN, FNP-C [facility] [address] [place], [place] [address] [phone]

## 2018-01-22 DIAGNOSIS — Z5321 Procedure and treatment not carried out due to patient leaving prior to being seen by health care provider: Secondary | ICD-10-CM | POA: Insufficient documentation

## 2018-01-22 DIAGNOSIS — F419 Anxiety disorder, unspecified: Secondary | ICD-10-CM | POA: Insufficient documentation

## 2018-01-23 ENCOUNTER — Emergency Department (HOSPITAL_COMMUNITY)
Admission: EM | Admit: 2018-01-23 | Discharge: 2018-01-23 | Disposition: A | Discharge status: Home / Self Care | Payer: Self-pay | Attending: Emergency Medicine | Admitting: Emergency Medicine

## 2018-01-23 ENCOUNTER — Encounter (HOSPITAL_COMMUNITY): Payer: Self-pay | Admitting: Nurse Practitioner

## 2018-01-23 NOTE — ED Notes (Signed)
Called for room no answer x 2  

## 2018-01-23 NOTE — ED Triage Notes (Signed)
Pt states he had an episode of panic, endorses hx of such states he ran out of Xanax that he takes everyday.

## 2018-01-24 ENCOUNTER — Other Ambulatory Visit: Payer: Self-pay

## 2018-01-24 ENCOUNTER — Encounter (HOSPITAL_COMMUNITY): Payer: Self-pay

## 2018-01-24 ENCOUNTER — Emergency Department (HOSPITAL_COMMUNITY)
Admission: EM | Admit: 2018-01-24 | Discharge: 2018-01-24 | Disposition: A | Discharge status: Home / Self Care | Payer: Self-pay | Attending: Emergency Medicine | Admitting: Emergency Medicine

## 2018-01-24 DIAGNOSIS — F419 Anxiety disorder, unspecified: Secondary | ICD-10-CM | POA: Insufficient documentation

## 2018-01-24 DIAGNOSIS — Z79899 Other long term (current) drug therapy: Secondary | ICD-10-CM | POA: Insufficient documentation

## 2018-01-24 DIAGNOSIS — R0602 Shortness of breath: Secondary | ICD-10-CM | POA: Insufficient documentation

## 2018-01-24 MED ORDER — HYDROXYZINE HCL 25 MG PO TABS: 25.0000 mg | ORAL_TABLET | Freq: Three times a day (TID) | ORAL | 0 refills | Status: DC | PRN

## 2018-01-24 MED ORDER — ALBUTEROL SULFATE (2.5 MG/3ML) 0.083% IN NEBU
5.0000 mg | INHALATION_SOLUTION | Freq: Once | RESPIRATORY_TRACT | Status: AC
  Administered 2018-01-24: 5 mg via RESPIRATORY_TRACT
  Filled 2018-01-24: qty 6

## 2018-01-24 NOTE — ED Notes (Signed)
Protocol orders entered for patient-SOB. Patient stated he did not want the CXR or the EKG. Order not placed due to patient refusing.

## 2018-01-24 NOTE — ED Triage Notes (Addendum)
Patient reports that he woke this AM with a headache and SOB. Patient states when he walks he is having SOB and feels like he can not get enough air in his lungs. Patient states he has used his inhaler, but not relieving.  Patient was in the ED yesterday AM, but left due to long wait. Patient states he has ran out of his anxiety medication and needs a refill

## 2018-01-24 NOTE — ED Provider Notes (Signed)
[facility]-EMERGENCY DEPT Provider Note   CSN: [id number] Arrival date & time: 01/24/18  4098     History   Chief Complaint Chief Complaint  Patient presents with  . Headache  . Shortness of Breath  . Medication Refill    HPI Jose Hall is a 32 y.o. male presenting for evaluation of headache and shortness of breath.  Patient states he has a history of asthma.  Recently started on Qvar daily.  He has had intermittent worsening of shortness of breath over the past week.  This morning he felt very short of breath, which is mildly improved with his albuterol inhaler.  He reports continued shortness of breath with exertion.  He did not use his Qvar today.  He has treatment nasal congestion, uses Flonase.  He has a history of allergies, but does not take medication for this.  His asthma is currently being managed by his primary care doctor, who recently start the Qvar.  He denies fevers, chills, chest pain, cough, nausea, vomiting, abdominal pain, urinary symptoms, normal bowel movements.  No sick contacts.  Patient denies recent travel, surgery, trauma, immobilization, history of cancer, history of blood clots, or hormone use.  Additionally, patient requesting refill of anxiety medicine.  He takes Xanax.  HPI  Past Medical History:  Diagnosis Date  . Anxiety   . Asthma     Patient Active Problem List   Diagnosis Date Noted  . History of palpitations 09/09/2017  . Dyspnea on exertion 09/09/2017  . Anxiety about health 09/09/2017  . Suspected exposure to mold 09/09/2017  . History of asthma 12/23/2006    History reviewed. No pertinent surgical history.      Home Medications    Prior to Admission medications   Medication Sig Start Date End Date Taking? Authorizing Provider  albuterol (PROVENTIL HFA;VENTOLIN HFA) 108 (90 Base) MCG/ACT inhaler Inhale 1-2 puffs into the lungs every 6 (six) hours as needed for wheezing or shortness of breath. 01/08/18  Yes  Palumbo, April, MD  ALPRAZolam Prudy Feeler) 0.25 MG tablet Take 1 tablet (0.25 mg total) by mouth at bedtime as needed for anxiety. 12/23/17  Yes Terrilee Files, MD  beclomethasone (QVAR REDIHALER) 80 MCG/ACT inhaler Inhale 2 puffs into the lungs 2 (two) times daily. 01/17/18  Yes Massie Maroon, FNP  cetirizine (ZYRTEC) 10 MG tablet Take 1 tablet (10 mg total) by mouth daily. 12/21/17  Yes Quentin Angst, MD  fluticasone (FLONASE) 50 MCG/ACT nasal spray Place 2 sprays into both nostrils daily. 12/21/17  Yes Jegede, Olugbemiga E, MD  fluticasone (FLOVENT DISKUS) 50 MCG/BLIST diskus inhaler Inhale 1 puff into the lungs 2 (two) times daily. Patient not taking: Reported on 12/23/2017 12/07/17   Jacinto Halim, PA-C  hydrOXYzine (ATARAX/VISTARIL) 25 MG tablet Take 1 tablet (25 mg total) by mouth every 8 (eight) hours as needed. 01/24/18   Eino Whitner, PA-C  montelukast (SINGULAIR) 10 MG tablet Take 1 tablet (10 mg total) by mouth at bedtime. Patient not taking: Reported on 01/24/2018 01/17/18   Massie Maroon, FNP    Family History Family History  Problem Relation Age of Onset  . Hypertension Other   . Diabetes Other   . Asthma Father     Social History Social History   Tobacco Use  . Smoking status: Never Smoker  . Smokeless tobacco: Never Used  Substance Use Topics  . Alcohol use: No    Frequency: Never  . Drug use: No  Allergies   Patient has no known allergies.   Review of Systems Review of Systems  Respiratory: Positive for shortness of breath.   Psychiatric/Behavioral: The patient is nervous/anxious.   All other systems reviewed and are negative.    Physical Exam Updated Vital Signs BP (!) 152/98 (BP Location: Right Arm)   Pulse 85   Temp (!) 97.5 F (36.4 C) (Oral)   Resp 20   Ht 5\' 7"  (1.702 m)   Wt 72.6 kg (160 lb)   SpO2 100%   BMI 25.06 kg/m   Physical Exam  Constitutional: He is oriented to person, place, and time. He appears  well-developed and well-nourished. No distress.  Patient resting comfortably in no apparent distress.  HENT:  Head: Normocephalic and atraumatic.  Right Ear: Tympanic membrane, external ear and ear canal normal.  Left Ear: Tympanic membrane, external ear and ear canal normal.  Nose: Mucosal edema present.  Mouth/Throat: Uvula is midline, oropharynx is clear and moist and mucous membranes are normal.  Eyes: Pupils are equal, round, and reactive to light. Conjunctivae and EOM are normal.  Neck: Normal range of motion. Neck supple.  Cardiovascular: Normal rate, regular rhythm and intact distal pulses.  Pulmonary/Chest: Effort normal and breath sounds normal. No respiratory distress. He has no wheezes.  Speaking full sentences.  Clear lung sounds in all fields without wheezing, rales, or rhonchi.  No signs of respiratory distress or shortness of breath.  Abdominal: Soft. He exhibits no distension and no mass. There is no tenderness. There is no guarding.  Musculoskeletal: Normal range of motion.  No leg pain or swelling.  Neurological: He is alert and oriented to person, place, and time.  Skin: Skin is warm and dry.  Psychiatric: He has a normal mood and affect.  Nursing note and vitals reviewed.    ED Treatments / Results  Labs (all labs ordered are listed, but only abnormal results are displayed) Labs Reviewed - No data to display  EKG None  Radiology No results found.  Procedures Procedures (including critical care time)  Medications Ordered in ED Medications  albuterol (PROVENTIL) (2.5 MG/3ML) 0.083% nebulizer solution 5 mg (5 mg Nebulization Given 01/24/18 0950)     Initial Impression / Assessment and Plan / ED Course  I have reviewed the triage vital signs and the nursing notes.  Pertinent labs & imaging results that were available during my care of the patient were reviewed by me and considered in my medical decision making (see chart for details).     Patient  presenting for evaluation of shortness of breath and anxiety.  Physical exam reassuring, patient is afebrile not tachycardic.  No chest pain.  Pulmonary exam reassuring without wheezing or signs of respite way distress.  No risk factors for PE, patient sats remain elevated at rest and with ambulation.  Doubt PE at this time.  Doubt ACS as cause of the shortness of breath.  Likely a combination of asthma, allergies, and anxiety.  Discussed findings with patient.  Discussed use of daily medications as preventative and albuterol as needed.  Patient requesting refill of Xanax, which I told him I am unable to do from the ER at this time.  If he needs a refill, this must be done by his primary care doctor.  Will supply Vistaril in the meantime.  At this time, patient appears safe for discharge.  Return precautions given.  Patient states he understands and agrees to plan.   Final Clinical Impressions(s) / ED Diagnoses  Final diagnoses:  Shortness of breath  Anxiety    ED Discharge Orders        Ordered    hydrOXYzine (ATARAX/VISTARIL) 25 MG tablet  Every 8 hours PRN     01/24/18 1326       Cabrina Shiroma, PA-C 01/24/18 1330    Samuel Jester, DO 01/25/18 1310

## 2018-01-24 NOTE — ED Notes (Signed)
Pt ambulated and maintained O2 saturations at 97-100% on RA.  Provider made aware

## 2018-01-24 NOTE — Discharge Instructions (Signed)
Continue taking medications as prescribed.  Use Qvar daily and the albuterol as needed. Continue using your nasal spray daily. You may take hydroxyzine as needed for anxiety.  Make sure you stay well-hydrated while taking this medication, as it can make you thirsty. Follow up with your primary care doctor for further evaluation of your shortness of breath and anxiety.  If you need a refill of your benzodiazepines, this must be done by your primary care doctor, cannot be done in the ER. Return to the emergency room for wheezing not improved with albuterol, high fevers, chest pain, or any new or concerning symptoms.

## 2018-01-28 ENCOUNTER — Ambulatory Visit (INDEPENDENT_AMBULATORY_CARE_PROVIDER_SITE_OTHER): Payer: Self-pay | Admitting: Family Medicine

## 2018-01-28 ENCOUNTER — Other Ambulatory Visit: Payer: Self-pay

## 2018-01-28 ENCOUNTER — Encounter (HOSPITAL_COMMUNITY): Payer: Self-pay

## 2018-01-28 ENCOUNTER — Emergency Department (HOSPITAL_COMMUNITY)
Admission: EM | Admit: 2018-01-28 | Discharge: 2018-01-28 | Disposition: A | Discharge status: Home / Self Care | Payer: Self-pay | Attending: Emergency Medicine | Admitting: Emergency Medicine

## 2018-01-28 ENCOUNTER — Emergency Department (HOSPITAL_COMMUNITY): Payer: Self-pay

## 2018-01-28 ENCOUNTER — Encounter: Payer: Self-pay | Admitting: Family Medicine

## 2018-01-28 VITALS — BP 132/78 | HR 88 | Temp 97.7°F | Ht 67.0 in | Wt 156.0 lb

## 2018-01-28 DIAGNOSIS — R0602 Shortness of breath: Secondary | ICD-10-CM

## 2018-01-28 DIAGNOSIS — Z79899 Other long term (current) drug therapy: Secondary | ICD-10-CM | POA: Insufficient documentation

## 2018-01-28 DIAGNOSIS — J45909 Unspecified asthma, uncomplicated: Secondary | ICD-10-CM | POA: Insufficient documentation

## 2018-01-28 DIAGNOSIS — R002 Palpitations: Secondary | ICD-10-CM

## 2018-01-28 DIAGNOSIS — F458 Other somatoform disorders: Secondary | ICD-10-CM | POA: Insufficient documentation

## 2018-01-28 DIAGNOSIS — I471 Supraventricular tachycardia: Secondary | ICD-10-CM | POA: Insufficient documentation

## 2018-01-28 DIAGNOSIS — R0989 Other specified symptoms and signs involving the circulatory and respiratory systems: Secondary | ICD-10-CM

## 2018-01-28 DIAGNOSIS — F419 Anxiety disorder, unspecified: Secondary | ICD-10-CM | POA: Insufficient documentation

## 2018-01-28 DIAGNOSIS — J4540 Moderate persistent asthma, uncomplicated: Secondary | ICD-10-CM

## 2018-01-28 LAB — CBC
HEMATOCRIT: 42.8 % (ref 39.0–52.0)
HEMOGLOBIN: 15.8 g/dL (ref 13.0–17.0)
MCH: 29.5 pg (ref 26.0–34.0)
MCHC: 36.9 g/dL — ABNORMAL HIGH (ref 30.0–36.0)
MCV: 80 fL (ref 78.0–100.0)
Platelets: 286 10*3/uL (ref 150–400)
RBC: 5.35 MIL/uL (ref 4.22–5.81)
RDW: 13.9 % (ref 11.5–15.5)
WBC: 8.7 10*3/uL (ref 4.0–10.5)

## 2018-01-28 LAB — BASIC METABOLIC PANEL
ANION GAP: 7 (ref 5–15)
BUN: 13 mg/dL (ref 6–20)
CALCIUM: 9.3 mg/dL (ref 8.9–10.3)
CHLORIDE: 108 mmol/L (ref 101–111)
CO2: 24 mmol/L (ref 22–32)
Creatinine, Ser: 1.17 mg/dL (ref 0.61–1.24)
GFR calc Af Amer: >60 mL/min (ref >60)
GFR calc non Af Amer: >60 mL/min (ref >60)
Glucose, Bld: 182 mg/dL — ABNORMAL HIGH (ref 65–99)
POTASSIUM: 4.4 mmol/L (ref 3.5–5.1)
Sodium: 139 mmol/L (ref 135–145)

## 2018-01-28 LAB — RAPID URINE DRUG SCREEN, HOSP PERFORMED
Amphetamines: NOT DETECTED
BARBITURATES: NOT DETECTED
Benzodiazepines: NOT DETECTED
Cocaine: NOT DETECTED
Opiates: NOT DETECTED
TETRAHYDROCANNABINOL: NOT DETECTED

## 2018-01-28 LAB — TSH: TSH: 0.43 u[IU]/mL (ref 0.350–4.500)

## 2018-01-28 MED ORDER — METOPROLOL TARTRATE 5 MG/5ML IV SOLN
5.0000 mg | Freq: Once | INTRAVENOUS | Status: AC
  Administered 2018-01-28: 5 mg via INTRAVENOUS
  Filled 2018-01-28: qty 5

## 2018-01-28 MED ORDER — METHYLPREDNISOLONE SODIUM SUCC 125 MG IJ SOLR
125.0000 mg | Freq: Once | INTRAMUSCULAR | Status: AC
  Administered 2018-01-28: 125 mg via INTRAMUSCULAR

## 2018-01-28 MED ORDER — ALBUTEROL SULFATE (2.5 MG/3ML) 0.083% IN NEBU
2.5000 mg | INHALATION_SOLUTION | Freq: Once | RESPIRATORY_TRACT | Status: AC
  Administered 2018-01-28: 2.5 mg via RESPIRATORY_TRACT

## 2018-01-28 MED ORDER — LORAZEPAM 2 MG/ML IJ SOLN
1.0000 mg | Freq: Once | INTRAMUSCULAR | Status: DC
  Filled 2018-01-28: qty 1

## 2018-01-28 MED ORDER — IPRATROPIUM BROMIDE 0.02 % IN SOLN
0.5000 mg | Freq: Once | RESPIRATORY_TRACT | Status: AC
  Administered 2018-01-28: 0.5 mg via RESPIRATORY_TRACT

## 2018-01-28 MED ORDER — PREDNISONE 20 MG PO TABS: 40.0000 mg | ORAL_TABLET | Freq: Every day | ORAL | 0 refills | Status: AC

## 2018-01-28 MED ORDER — SODIUM CHLORIDE 0.9 % IV BOLUS
1000.0000 mL | Freq: Once | INTRAVENOUS | Status: AC
  Administered 2018-01-28: 1000 mL via INTRAVENOUS

## 2018-01-28 MED ORDER — METOPROLOL TARTRATE 25 MG PO TABS: 25.0000 mg | ORAL_TABLET | Freq: Two times a day (BID) | ORAL | 0 refills | Status: DC

## 2018-01-28 NOTE — ED Triage Notes (Signed)
Pt stated that he must have been sitting outside when he was called to the treatment area. Denies pain or difficulty swallowing at this time. Pt was encouraged to stay and be evaluated by PA

## 2018-01-28 NOTE — ED Notes (Signed)
Pt is aware that we need a urine specimen. He has been given a urinal.

## 2018-01-28 NOTE — ED Triage Notes (Addendum)
Pt presents today with "difficulty swallowing" after injection at [facility] this morning. Speaking in full sentences without difficulty. Maintaining saliva. Denies tongue swelling. Denies throat pain. Lung sounds clear at this time. Denies SHOB.

## 2018-01-28 NOTE — Discharge Instructions (Addendum)
You need to follow-up with the cardiologist regarding your episode of tachycardia.  Return here to the emergency room if you have ongoing or worsening symptoms.  Your blood sugar was mildly elevated.  This needs to be followed up by her primary care provider.  At this time, your blood pressure can also be rechecked.  Return the emergency room if you have worsening symptoms or rapid heart rate the last more than 10 or 15 minutes.

## 2018-01-28 NOTE — Progress Notes (Signed)
Patient ID: Jose HanksLamar Q Fayette, male    DOB: 06/16/1986, 32 y.o.   MRN: [id number]  PCP: Quentin AngstJegede, Olugbemiga E, MD  Chief Complaint  Patient presents with  . Asthma  . Shortness of Breath    Subjective:  HPI Jose Hall is a 32 y.o. male presents for evaluation of chronic asthma and shortness of breath. Patient was seen in office for routine follow-up of asthma 01/17/2018 and was started on cetrizine for allergic rhinitis and continue on nebulizer treatments, QVAR, and Singulair. He presented to the ED on 01/24/2018 for worsening shortness of breath and anxiety. Patient was prescribed Xanax by another provider at a different practice while at the ED requested this a refill of Xanax as he felt his anxiety was heighten with experiencing SOB. While at the ED he admitted to skipping his QVAR on the day he presented to the ED. Stopped taking Singulair as medication made him "feel funny". Reports unintentional weight loss due to worsening shortness of breath. He unable to talk in complete sentences or laugh without experiencing SOB. Averages use of short-acting inhaler is 3-4 times per day. Reports consistent use of QVAR. Wishes to have a referral to pulmonology as his financial assistance is pending. He denies wheezing, coughing, or chest tightness. Endorses bilateral rib cage pain with breathing. Social History   Socioeconomic History  . Marital status: Single    Spouse name: Not on file  . Number of children: Not on file  . Years of education: Not on file  . Highest education level: Not on file  Occupational History  . Not on file  Social Needs  . Financial resource strain: Not on file  . Food insecurity:    Worry: Not on file    Inability: Not on file  . Transportation needs:    Medical: Not on file    Non-medical: Not on file  Tobacco Use  . Smoking status: Never Smoker  . Smokeless tobacco: Never Used  Substance and Sexual Activity  . Alcohol use: No    Frequency: Never  . Drug use:  No  . Sexual activity: Yes  Lifestyle  . Physical activity:    Days per week: Not on file    Minutes per session: Not on file  . Stress: Not on file  Relationships  . Social connections:    Talks on phone: Not on file    Gets together: Not on file    Attends religious service: Not on file    Active member of club or organization: Not on file    Attends meetings of clubs or organizations: Not on file    Relationship status: Not on file  . Intimate partner violence:    Fear of current or ex partner: Not on file    Emotionally abused: Not on file    Physically abused: Not on file    Forced sexual activity: Not on file  Other Topics Concern  . Not on file  Social History Narrative   Merge to         Family History  Problem Relation Age of Onset  . Hypertension Other   . Diabetes Other   . Asthma Father    Review of Systems Pertinent negatives listed in HPI Patient Active Problem List   Diagnosis Date Noted  . History of palpitations 09/09/2017  . Dyspnea on exertion 09/09/2017  . Anxiety about health 09/09/2017  . Suspected exposure to mold 09/09/2017  . History of asthma 12/23/2006  No Known Allergies  Prior to Admission medications   Medication Sig Start Date End Date Taking? Authorizing Provider  albuterol (PROVENTIL HFA;VENTOLIN HFA) 108 (90 Base) MCG/ACT inhaler Inhale 1-2 puffs into the lungs every 6 (six) hours as needed for wheezing or shortness of breath. 01/08/18  Yes Palumbo, April, MD  cetirizine (ZYRTEC) 10 MG tablet Take 1 tablet (10 mg total) by mouth daily. 12/21/17  Yes Quentin Angst, MD  fluticasone (FLONASE) 50 MCG/ACT nasal spray Place 2 sprays into both nostrils daily. 12/21/17  Yes Jegede, Olugbemiga E, MD  fluticasone (FLOVENT DISKUS) 50 MCG/BLIST diskus inhaler Inhale 1 puff into the lungs 2 (two) times daily. 12/07/17  Yes Maczis, Elmer Sow, PA-C  hydrOXYzine (ATARAX/VISTARIL) 25 MG tablet Take 1 tablet (25 mg total) by mouth every 8  (eight) hours as needed. 01/24/18  Yes Caccavale, Sophia, PA-C  ALPRAZolam (XANAX) 0.25 MG tablet Take 1 tablet (0.25 mg total) by mouth at bedtime as needed for anxiety. Patient not taking: Reported on 01/28/2018 12/23/17   Terrilee Files, MD  beclomethasone (QVAR REDIHALER) 80 MCG/ACT inhaler Inhale 2 puffs into the lungs 2 (two) times daily. Patient not taking: Reported on 01/28/2018 01/17/18   Massie Maroon, FNP  montelukast (SINGULAIR) 10 MG tablet Take 1 tablet (10 mg total) by mouth at bedtime. Patient not taking: Reported on 01/24/2018 01/17/18   Massie Maroon, FNP    Past Medical, Surgical Family and Social History reviewed and updated.    Objective:   Today's Vitals   01/28/18 0832  BP: 132/78  Pulse: 88  Temp: 97.7 F (36.5 C)  TempSrc: Oral  SpO2: 100%  Weight: 156 lb (70.8 kg)  Height: 5\' 7"  (1.702 m)    Wt Readings from Last 3 Encounters:  01/28/18 156 lb (70.8 kg)  01/24/18 160 lb (72.6 kg)  01/17/18 159 lb (72.1 kg)    Physical Exam  Constitutional: He is oriented to person, place, and time. He appears well-developed and well-nourished.  HENT:  Head: Normocephalic and atraumatic.  Eyes: Pupils are equal, round, and reactive to light.  Cardiovascular: Normal rate, regular rhythm, normal heart sounds and intact distal pulses.  Pulmonary/Chest: Effort normal. He has decreased breath sounds. He has no wheezes. He has no rhonchi. He has no rales.  Neurological: He is alert and oriented to person, place, and time.  Skin: Skin is warm and dry.  Psychiatric: He has a normal mood and affect. His behavior is normal. Thought content normal.   Assessment & Plan:  1. Moderate persistent asthma without complication, acute on chronic exacerbation.  Today will administer a Duo-nebulizer treatment. Provided a prescription for patient to obtain a nebulizer machine from any retail medical supply store and notify office as we can send a prescription for neb solutions to [facility].  He was advised to continue all medications as prescribed with the exception of discontinuing Singulair as medication causes him to feel "strange".   2. SOB (shortness of breath), persistent, chronic, and non-responsive to multiple medication therapy. Referring patient to Pulmonology for PFT and further evaluation of asthma and SOB Start prednisone 40 mg x 5 days with food.   RTC: 1 month for asthma follow-up  Godfrey Pick. Tiburcio Pea, MSN, FNP-C [facility]  [address] [place], [place] [address] [phone]

## 2018-01-28 NOTE — Patient Instructions (Addendum)
Start prednisone 40 mg (2 tablets) today with food, and continue 40 mg with breakfast daily until medication is complete.  Continue QVAR and Albuterol as directed. Continue cetrizine.    If you are able to obtain nebulizer machine, we can order nebulizer medication and send to [facility].     Shortness of Breath, Adult Shortness of breath means you have trouble breathing. Your lungs are organs for breathing. Follow these instructions at home: Pay attention to any changes in your symptoms. Take these actions to help with your condition:  Do not smoke. Smoking can cause shortness of breath. If you need help to quit smoking, ask your doctor.  Avoid things that can make it harder to breathe, such as: ? Mold. ? Dust. ? Air pollution. ? Chemical smells. ? Things that can cause allergy symptoms (allergens), if you have allergies.  Keep your living space clean and free of mold and dust.  Rest as needed. Slowly return to your usual activities.  Take over-the-counter and prescription medicines, including oxygen and inhaled medicines, only as told by your doctor.  Keep all follow-up visits as told by your doctor. This is important.  Contact a doctor if:  Your condition does not get better as soon as expected.  You have a hard time doing your normal activities, even after you rest.  You have new symptoms. Get help right away if:  You have trouble breathing when you are resting.  You feel light-headed or you faint.  You have a cough that is not helped by medicines.  You cough up blood.  You have pain with breathing.  You have pain in your chest, arms, shoulders, or belly (abdomen).  You have a fever.  You cannot walk up stairs.  You cannot exercise the way you normally do. This information is not intended to replace advice given to you by your health care provider. Make sure you discuss any questions you have with your health care provider. Document  Released: 03/30/2008 Document Revised: 10/29/2016 Document Reviewed: 10/29/2016 Elsevier Interactive Patient Education  2017 [employer]vier Inc.  Asthma Attack Prevention, Adult Although you may not be able to control the fact that you have asthma, you can take actions to prevent episodes of asthma (asthma attacks). These actions include:  Creating a written plan for managing and treating your asthma attacks (asthma action plan).  Monitoring your asthma.  Avoiding things that can irritate your airways or make your asthma symptoms worse (asthma triggers).  Taking your medicines as directed.  Acting quickly if you have signs or symptoms of an asthma attack.  What are some ways to prevent an asthma attack? Create a plan Work with your health care provider to create an asthma action plan. This plan should include:  A list of your asthma triggers and how to avoid them.  A list of symptoms that you experience during an asthma attack.  Information about when to take medicine and how much medicine to take.  Information to help you understand your peak flow measurements.  Contact information for your health care providers.  Daily actions that you can take to control asthma.  Monitor your asthma  To monitor your asthma:  Use your peak flow meter every morning and every evening for 2-3 weeks. Record the results in a journal. A drop in your peak flow numbers on one or more days may mean that you are starting to have an asthma attack, even if you are not having symptoms.  When  you have asthma symptoms, write them down in a journal.  Avoid asthma triggers  Work with your health care provider to find out what your asthma triggers are. This can be done by:  Being tested for allergies.  Keeping a journal that notes when asthma attacks occur and what may have contributed to them.  Asking your health care provider whether other medical conditions make your asthma worse.  Common asthma  triggers include:  Dust.  Smoke. This includes campfire smoke and secondhand smoke from tobacco products.  Pet dander.  Trees, grasses or pollens.  Very cold, dry, or humid air.  Mold.  Foods that contain high amounts of sulfites.  Strong smells.  Engine exhaust and air pollution.  Aerosol sprays and fumes from household cleaners.  Household pests and their droppings, including dust mites and cockroaches.  Certain medicines, including NSAIDs.  Once you have determined your asthma triggers, take steps to avoid them. Depending on your triggers, you may be able to reduce the chance of an asthma attack by:  Keeping your home clean. Have someone dust and vacuum your home for you 1 or 2 times a week. If possible, have them use a high-efficiency particulate arrestance (HEPA) vacuum.  Washing your sheets weekly in hot water.  Using allergy-proof mattress covers and casings on your bed.  Keeping pets out of your home.  Taking care of mold and water problems in your home.  Avoiding areas where people smoke.  Avoiding using strong perfumes or odor sprays.  Avoid spending a lot of time outdoors when pollen counts are high and on very windy days.  Talking with your health care provider before stopping or starting any new medicines.  Medicines Take over-the-counter and prescription medicines only as told by your health care provider. Many asthma attacks can be prevented by carefully following your medicine schedule. Taking your medicines correctly is especially important when you cannot avoid certain asthma triggers. Even if you are doing well, do not stop taking your medicine and do not take less medicine. Act quickly If an asthma attack happens, acting quickly can decrease how severe it is and how long it lasts. Take these actions:  Pay attention to your symptoms. If you are coughing, wheezing, or having difficulty breathing, do not wait to see if your symptoms go away on their  own. Follow your asthma action plan.  If you have followed your asthma action plan and your symptoms are not improving, call your health care provider or seek immediate medical care at the nearest hospital.  It is important to write down how often you need to use your fast-acting rescue inhaler. You can track how often you use an inhaler in your journal. If you are using your rescue inhaler more often, it may mean that your asthma is not under control. Adjusting your asthma treatment plan may help you to prevent future asthma attacks and help you to gain better control of your condition. How can I prevent an asthma attack when I exercise?  Exercise is a common asthma trigger. To prevent asthma attacks during exercise:  Follow advice from your health care provider about whether you should use your fast-acting inhaler before exercising. Many people with asthma experience exercise-induced bronchoconstriction (EIB). This condition often worsens during vigorous exercise in cold, humid, or dry environments. Usually, people with EIB can stay very active by using a fast-acting inhaler before exercising.  Avoid exercising outdoors in very cold or humid weather.  Avoid exercising outdoors when pollen  counts are high.  Warm up and cool down when exercising.  Stop exercising right away if asthma symptoms start.  Consider taking part in exercises that are less likely to cause asthma symptoms such as:  Indoor swimming.  Biking.  Walking.  Hiking.  Playing football.  This information is not intended to replace advice given to you by your health care provider. Make sure you discuss any questions you have with your health care provider. Document Released: 09/30/2009 Document Revised: 06/12/2016 Document Reviewed: 03/28/2016 Elsevier Interactive Patient Education  [employer]2018 Elsevier Inc.

## 2018-01-28 NOTE — ED Notes (Signed)
Pt converted from SVT to sinus tachycardia. Second EKG given to Dr. Fredderick PhenixBelfi.

## 2018-01-28 NOTE — ED Notes (Signed)
Patient's HR 178. EKG given to Dr. Fredderick PhenixBelfi.

## 2018-01-28 NOTE — ED Notes (Signed)
Bed: [id number] Expected date:  Expected time:  Means of arrival:  Comments: Barry DienesOwens from [facility]

## 2018-01-28 NOTE — ED Provider Notes (Signed)
MSE was initiated and I personally evaluated the patient and placed orders (if any) at  3:41 PM on January 28, 2018.  The patient appears stable so that the remainder of the MSE may be completed by another provider.  This 32 year old male with a past medical history of asthma who presents to the emergency department for palpitations and globus sensation.  He has a past medical history of moderate persistent asthma and was seen by his primary care physician this morning.  Patient states that prior to getting his Solu-Medrol shot he felt like there was something stuck in his throat with swallowing.  He has continued to feel that way has had intermittent racing of his heart.  He states that he has been worried about his health lately and that in the past he got some antianxiety medication that helped.  He denies smoking or alcohol abuse.  States that he feels like his heart is racing.   3:44 PM BP 138/89 (BP Location: Left Arm)   Pulse (!) 131   Temp 98.3 F (36.8 C) (Oral)   Resp 18   Ht 5\' 7"  (1.702 m)   Wt 70.8 kg (156 lb)   SpO2 100%   BMI 24.43 kg/m   His initial heart rate at 84.  During my evaluation he grabbed his chest and states "See, I feeling like my heart is racing right now."  Vital signs show a pulse rate of 131.  I have initiated medical screening evaluation to include cardiac monitoring, labs for chest pain, thyroid stimulating hormone and UDS.  Patient will be placed in acute care for further workup.  He had a DuoNeb treatment more than 4 hours ago.   Arthor CaptainHarris, Ramesh Moan, PA-C 01/28/18 1546    [name], Courteney Lyn, MD 01/28/18 [phone]

## 2018-01-28 NOTE — ED Provider Notes (Addendum)
With [facility] Provider Note   CSN: [id number] Arrival date & time: 01/28/18  1105     History   Chief Complaint No chief complaint on file.   HPI Jose Hall is a 32 y.o. male throat swelling and tachycardia.  He states that he has been having some increased trouble with his asthma.  He went to his PCPs office this morning where they gave him albuterol treatment and a shot of Solu-Medrol.  He states he at that time was having some slight feeling of swelling in his throat.  The swelling in his throat has subsequently resolved.  He has no difficulty swallowing.  No sore throat.  He had initially checked and because of that however when he found out there is limited to our weight, he went to get something to eat and came back.  When he got back he realized he had already been called.  He was actually going to be discharged from triage and then was found to be tachycardic.  He does say he has been having some palpitations since arrival to the emergency room and feels like his heart is racing.  He denies any chest pain or shortness of breath.  No dizziness.  No leg pain or swelling.  No history of heart problems.  He states he has had this feeling before during anxiety attacks.  He does not feel overly anxious currently.  No fevers coughing or other recent illnesses other than his asthma..  Patient is a 32 year old male with a history of asthma and anxiety who presents     Past Medical History:  Diagnosis Date  . Anxiety   . Asthma     Patient Active Problem List   Diagnosis Date Noted  . History of palpitations 09/09/2017  . Dyspnea on exertion 09/09/2017  . Anxiety about health 09/09/2017  . Suspected exposure to mold 09/09/2017  . History of asthma 12/23/2006    History reviewed. No pertinent surgical history.      Home Medications    Prior to Admission medications   Medication Sig Start Date End Date Taking? Authorizing Provider    albuterol (PROVENTIL HFA;VENTOLIN HFA) 108 (90 Base) MCG/ACT inhaler Inhale 1-2 puffs into the lungs every 6 (six) hours as needed for wheezing or shortness of breath. 01/08/18  Yes Palumbo, April, MD  ALPRAZolam Prudy Feeler) 0.25 MG tablet Take 1 tablet (0.25 mg total) by mouth at bedtime as needed for anxiety. 12/23/17  Yes Terrilee Files, MD  beclomethasone (QVAR REDIHALER) 80 MCG/ACT inhaler Inhale 2 puffs into the lungs 2 (two) times daily. 01/17/18  Yes Massie Maroon, FNP  cetirizine (ZYRTEC) 10 MG tablet Take 1 tablet (10 mg total) by mouth daily. 12/21/17  Yes Quentin Angst, MD  fluticasone (FLONASE) 50 MCG/ACT nasal spray Place 2 sprays into both nostrils daily. 12/21/17  Yes Quentin Angst, MD  hydrOXYzine (ATARAX/VISTARIL) 25 MG tablet Take 1 tablet (25 mg total) by mouth every 8 (eight) hours as needed. Patient not taking: Reported on 01/28/2018 01/24/18   Caccavale, Sophia, PA-C  metoprolol tartrate (LOPRESSOR) 25 MG tablet Take 1 tablet (25 mg total) by mouth 2 (two) times daily. 01/28/18   Rolan Bucco, MD  predniSONE (DELTASONE) 20 MG tablet Take 2 tablets (40 mg total) by mouth daily with breakfast for 5 days. Patient not taking: Reported on 01/28/2018 01/28/18 02/02/18  Bing Neighbors, FNP    Family History Family History  Problem Relation Age of Onset  .  Hypertension Other   . Diabetes Other   . Asthma Father     Social History Social History   Tobacco Use  . Smoking status: Never Smoker  . Smokeless tobacco: Never Used  Substance Use Topics  . Alcohol use: No    Frequency: Never  . Drug use: No     Allergies   Patient has no known allergies.   Review of Systems Review of Systems  Constitutional: Negative for chills, diaphoresis, fatigue and fever.  HENT: Negative for congestion, rhinorrhea and sneezing.   Eyes: Negative.   Respiratory: Negative for cough, chest tightness and shortness of breath.   Cardiovascular: Positive for palpitations.  Negative for chest pain and leg swelling.  Gastrointestinal: Negative for abdominal pain, blood in stool, diarrhea, nausea and vomiting.  Genitourinary: Negative for difficulty urinating, flank pain, frequency and hematuria.  Musculoskeletal: Negative for arthralgias and back pain.  Skin: Negative for rash.  Neurological: Negative for dizziness, speech difficulty, weakness, numbness and headaches.     Physical Exam Updated Vital Signs BP (!) 149/96   Pulse (!) 105   Temp 97.7 F (36.5 C) (Oral)   Resp 13   Ht 5\' 7"  (1.702 m)   Wt 70.8 kg (156 lb)   SpO2 100%   BMI 24.43 kg/m   Physical Exam  Constitutional: He is oriented to person, place, and time. He appears well-developed and well-nourished.  HENT:  Head: Normocephalic and atraumatic.  Eyes: Pupils are equal, round, and reactive to light.  Neck: Normal range of motion. Neck supple.  Cardiovascular: Regular rhythm and normal heart sounds. Tachycardia present.  Pulmonary/Chest: Effort normal and breath sounds normal. No respiratory distress. He has no wheezes. He has no rales. He exhibits no tenderness.  Abdominal: Soft. Bowel sounds are normal. There is no tenderness. There is no rebound and no guarding.  Musculoskeletal: Normal range of motion. He exhibits no edema.  No edema or calf tenderness  Lymphadenopathy:    He has no cervical adenopathy.  Neurological: He is alert and oriented to person, place, and time.  Skin: Skin is warm and dry. No rash noted.  Psychiatric: He has a normal mood and affect.     ED Treatments / Results  Labs (all labs ordered are listed, but only abnormal results are displayed) Labs Reviewed  BASIC METABOLIC PANEL - Abnormal; Notable for the following components:      Result Value   Glucose, Bld 182 (*)    All other components within normal limits  CBC - Abnormal; Notable for the following components:   MCHC 36.9 (*)    All other components within normal limits  RAPID URINE DRUG  SCREEN, HOSP PERFORMED  TSH    EKG EKG Interpretation  Date/Time:  Friday January 28 2018 17:30:56 EDT Ventricular Rate:  129 PR Interval:    QRS Duration: 89 QT Interval:  302 QTC Calculation: 443 R Axis:   117 Text Interpretation:  Sinus tachycardia Right axis deviation Confirmed by Rolan BuccoBelfi, Alicea Wente [phone]([id number]) on 01/28/2018 5:38:53 PM   Radiology Dg Chest 2 View  Result Date: 01/28/2018 CLINICAL DATA:  Tachycardia.  History of asthma. EXAM: CHEST - 2 VIEW COMPARISON:  Chest radiograph January 07, 2018 FINDINGS: Cardiomediastinal silhouette is normal. No pleural effusions or focal consolidations. Trachea projects midline and there is no pneumothorax. Soft tissue planes and included osseous structures are non-suspicious. IMPRESSION: Negative. Electronically Signed   By: Awilda Metroourtnay  Bloomer M.D.   On: 01/28/2018 15:54    Procedures Procedures (including  critical care time)  Medications Ordered in ED Medications  LORazepam (ATIVAN) injection 1 mg (0 mg Intravenous Hold 01/28/18 1644)  sodium chloride 0.9 % bolus 1,000 mL (0 mLs Intravenous Stopped 01/28/18 1801)  metoprolol tartrate (LOPRESSOR) injection 5 mg (5 mg Intravenous Given 01/28/18 1850)     Initial Impression / Assessment and Plan / ED Course  I have reviewed the triage vital signs and the nursing notes.  Pertinent labs & imaging results that were available during my care of the patient were reviewed by me and considered in my medical decision making (see chart for details).     Patient is a 32 year old male who presents initially with throat swelling but that has completely resolved.  He then was having some palpitations.  He states he has had similar palpitations in the past.  He initially presented with a sinus tachycardia.  However he did have an episode where he went into SVT with a heart rate in the 180s.  This rapidly converted without intervention.  He was not symptomatic other than the palpitations.  His labs are  non-concerning.  His blood sugar is mildly elevated.  His TSH is in the normal range.  He was given 1 dose of Lopressor and his heart rate is now around 100.  He is completely asymptomatic.  He was discharged home in good condition.  He was given a prescription for Lopressor.  He was given referral to follow-up with cardiology.  He was also advised to follow-up with his PCP regarding his elevated blood sugar and blood pressure which will need to be rechecked.  Return precautions were given.  CRITICAL CARE Performed by: Rolan Bucco Total critical care time: 40 minutes Critical care time was exclusive of separately billable procedures and treating other patients. Critical care was necessary to treat or prevent imminent or life-threatening deterioration. Critical care was time spent personally by me on the following activities: development of treatment plan with patient and/or surrogate as well as nursing, discussions with consultants, evaluation of patient's response to treatment, examination of patient, obtaining history from patient or surrogate, ordering and performing treatments and interventions, ordering and review of laboratory studies, ordering and review of radiographic studies, pulse oximetry and re-evaluation of patient's condition.   Final Clinical Impressions(s) / ED Diagnoses   Final diagnoses:  Globus sensation  Palpitations  SVT (supraventricular tachycardia) [facility])    ED Discharge Orders        Ordered    metoprolol tartrate (LOPRESSOR) 25 MG tablet  2 times daily     01/28/18 1939       Rolan Bucco, MD 01/28/18 1610    Rolan Bucco, MD 02/20/18 1459

## 2018-01-28 NOTE — ED Notes (Signed)
PA advised of elevated heart rate and patient c/o palpatations

## 2018-01-28 NOTE — ED Notes (Signed)
Pt stated that he left the ED after registering. He heard that would be a two hour wait , so he went to get lunch. He stated that he waited in his car before returning to ED

## 2018-01-31 ENCOUNTER — Emergency Department (HOSPITAL_COMMUNITY)
Admission: EM | Admit: 2018-01-31 | Discharge: 2018-01-31 | Disposition: A | Discharge status: Other Acute Inpatient Hospital | Payer: Self-pay

## 2018-01-31 ENCOUNTER — Emergency Department (HOSPITAL_COMMUNITY): Admission: EM | Admit: 2018-01-31 | Discharge: 2018-01-31 | Discharge status: Against Medical Advice | Payer: Self-pay

## 2018-02-01 ENCOUNTER — Emergency Department (HOSPITAL_COMMUNITY)
Admission: EM | Admit: 2018-02-01 | Discharge: 2018-02-01 | Disposition: A | Discharge status: Home / Self Care | Payer: Self-pay | Attending: Emergency Medicine | Admitting: Emergency Medicine

## 2018-02-01 ENCOUNTER — Encounter (HOSPITAL_COMMUNITY): Payer: Self-pay | Admitting: *Deleted

## 2018-02-01 DIAGNOSIS — F419 Anxiety disorder, unspecified: Secondary | ICD-10-CM | POA: Insufficient documentation

## 2018-02-01 DIAGNOSIS — Z79899 Other long term (current) drug therapy: Secondary | ICD-10-CM | POA: Insufficient documentation

## 2018-02-01 DIAGNOSIS — J45909 Unspecified asthma, uncomplicated: Secondary | ICD-10-CM | POA: Insufficient documentation

## 2018-02-01 MED ORDER — HYDROXYZINE HCL 25 MG PO TABS
25.0000 mg | ORAL_TABLET | Freq: Four times a day (QID) | ORAL | 0 refills | Status: AC | PRN
Start: 2018-02-01 — End: 2018-02-06

## 2018-02-01 NOTE — ED Provider Notes (Signed)
[facility]   CSN: [id number] Arrival date & time: 02/01/18  1048     History   Chief Complaint Chief Complaint  Patient presents with  . Shortness of Breath  . Anxiety    HPI Jose Hall is a 32 y.o. male with a history of anxiety, asthma, who has had 17 ED visits in the past 6 months with  0 admissions.  He reports that he was given xanax at one of his many emergency room visits for his anxiety a shortness of breath and reports that he is now out of this.  He is requesting additional Xanax today.  He reports that he has been taking his Qvar recently and when he takes it it makes him feel like his chest is getting tight.  He will take his albuterol with mild relief.  He reports his breathing is ok today but that he is here for his anxiety.  He reports that he drives to the hospital after work every day to sit in the parking lot incase he needs to be seen.  He has not talked about this with his doctor.      HPI  Past Medical History:  Diagnosis Date  . Anxiety   . Asthma     Patient Active Problem List   Diagnosis Date Noted  . History of palpitations 09/09/2017  . Dyspnea on exertion 09/09/2017  . Anxiety about health 09/09/2017  . Suspected exposure to mold 09/09/2017  . History of asthma 12/23/2006    History reviewed. No pertinent surgical history.      Home Medications    Prior to Admission medications   Medication Sig Start Date End Date Taking? Authorizing Provider  albuterol (PROVENTIL HFA;VENTOLIN HFA) 108 (90 Base) MCG/ACT inhaler Inhale 1-2 puffs into the lungs every 6 (six) hours as needed for wheezing or shortness of breath. 01/08/18   Palumbo, April, MD  ALPRAZolam Prudy Feeler) 0.25 MG tablet Take 1 tablet (0.25 mg total) by mouth at bedtime as needed for anxiety. 12/23/17   Terrilee Files, MD  beclomethasone (QVAR REDIHALER) 80 MCG/ACT inhaler Inhale 2 puffs into the lungs 2 (two) times daily. 01/17/18    Massie Maroon, FNP  cetirizine (ZYRTEC) 10 MG tablet Take 1 tablet (10 mg total) by mouth daily. 12/21/17   Quentin Angst, MD  fluticasone (FLONASE) 50 MCG/ACT nasal spray Place 2 sprays into both nostrils daily. 12/21/17   Quentin Angst, MD  hydrOXYzine (ATARAX/VISTARIL) 25 MG tablet Take 1 tablet (25 mg total) by mouth every 6 (six) hours as needed for up to 5 days for anxiety. 02/01/18 02/06/18  Cristina Gong, PA-C  metoprolol tartrate (LOPRESSOR) 25 MG tablet Take 1 tablet (25 mg total) by mouth 2 (two) times daily. 01/28/18   Rolan Bucco, MD  predniSONE (DELTASONE) 20 MG tablet Take 2 tablets (40 mg total) by mouth daily with breakfast for 5 days. Patient not taking: Reported on 01/28/2018 01/28/18 02/02/18  Bing Neighbors, FNP    Family History Family History  Problem Relation Age of Onset  . Hypertension Other   . Diabetes Other   . Asthma Father     Social History Social History   Tobacco Use  . Smoking status: Never Smoker  . Smokeless tobacco: Never Used  Substance Use Topics  . Alcohol use: No    Frequency: Never  . Drug use: No     Allergies   Patient has no known allergies.  Review of Systems Review of Systems  Constitutional: Negative for chills and fever.  Respiratory: Positive for chest tightness (Not right now) and shortness of breath (Not right now). Negative for cough and wheezing.   Cardiovascular: Negative for chest pain.  Gastrointestinal: Negative for abdominal pain.  Psychiatric/Behavioral: Negative for agitation, behavioral problems and self-injury. The patient is nervous/anxious.   All other systems reviewed and are negative.    Physical Exam Updated Vital Signs BP (!) 166/106 (BP Location: Right Arm) Comment: pt is anxious  Pulse 100   Temp 98.8 F (37.1 C) (Oral)   Resp 17   SpO2 100%   Physical Exam  Constitutional: He is oriented to person, place, and time. He appears well-developed and well-nourished.   Non-toxic appearance. No distress.  HENT:  Head: Normocephalic and atraumatic.  Eyes: Conjunctivae are normal. Right eye exhibits no discharge. Left eye exhibits no discharge. No scleral icterus.  Neck: Normal range of motion. Neck supple.  Cardiovascular: Normal rate, regular rhythm and normal heart sounds.  Pulmonary/Chest: Effort normal and breath sounds normal. No stridor. No respiratory distress. He has no decreased breath sounds. He has no wheezes. He has no rhonchi. He has no rales. He exhibits no tenderness and no crepitus.  Abdominal: He exhibits no distension.  Musculoskeletal: Normal range of motion. He exhibits no edema or deformity.  Neurological: He is alert and oriented to person, place, and time. He exhibits normal muscle tone.  Skin: Skin is warm and dry. Capillary refill takes less than 2 seconds. He is not diaphoretic.  Psychiatric: His mood appears anxious. He is agitated.  Nursing note and vitals reviewed.    ED Treatments / Results  Labs (all labs ordered are listed, but only abnormal results are displayed) Labs Reviewed - No data to display  EKG None  Radiology No results found.  Procedures Procedures (including critical care time)  Medications Ordered in ED Medications - No data to display   Initial Impression / Assessment and Plan / ED Course  I have reviewed the triage vital signs and the nursing notes.  Pertinent labs & imaging results that were available during my care of the patient were reviewed by me and considered in my medical decision making (see chart for details).    Patient presents today for evaluation of anxiety over his asthma.  He has been seen 17 times in the emergency room in the past 6 months requiring 0 admissions.  He reports that earlier he was feeling short of breath, however says it is okay right now.  He requested a refill on his Xanax that he was given for his anxiety during 1 of his times in the emergency room.  I informed  him that it is not appropriate to obtain a Xanax refill from the emergency room.  I instead offered to him Atarax which he accepted.  He saw his primary care provider on Friday however reports he did not mention his anxiety to her than.  On my evaluation lungs are clear to auscultation bilaterally, he does not have decreased air movement or wheezing.  He appears anxious.  He was given recommendations for outpatient counseling and instructed to follow-up with his primary care provider regarding his multiple ED visits and his anxiety.  He has had multiple workups for his shortness of breath in the past, with multiple chest x-rays, most recent one was normal and given normal lung sounds I do not feel like he needs a new one today.  Patient is  PERC negative, not concern for DVT.  He is satting 100% on room air.  Return precautions were discussed.  Patient discharged home.  Final Clinical Impressions(s) / ED Diagnoses   Final diagnoses:  Anxiety    ED Discharge Orders        Ordered    hydrOXYzine (ATARAX/VISTARIL) 25 MG tablet  Every 6 hours PRN     02/01/18 1752       Cristina Gong, PA-C 02/01/18 1841    Jacalyn Lefevre, MD 02/01/18 2158

## 2018-02-01 NOTE — Discharge Instructions (Signed)
You are being prescribed a medication which may make you sleepy. For 24 hours after one dose please do not drive, operate heavy machinery, care for a small child with out another adult present, or perform any activities that may cause harm to you or someone else if you were to fall asleep or be impaired.   

## 2018-02-01 NOTE — ED Triage Notes (Signed)
Pt states he has been having shortness of breath and anxiety. Pt has been using his Qvar inhaler in the morning, which causes him to use his rescue inhaler d/t chest tightness. Pt states he has also been having anxiety.

## 2018-02-03 ENCOUNTER — Telehealth: Payer: Self-pay

## 2018-02-03 NOTE — Telephone Encounter (Signed)
Patient has [place] assistance now and would like a referral for a pulmonologist to evaluate his asthma.

## 2018-02-04 NOTE — Telephone Encounter (Signed)
Patient notified of appointment on 03/02/2018.

## 2018-02-04 NOTE — Telephone Encounter (Signed)
Please call [facility] to schedule patient with Dr. Delford FieldWright (Pulmonologist).

## 2018-02-06 ENCOUNTER — Encounter (HOSPITAL_COMMUNITY): Payer: Self-pay | Admitting: Emergency Medicine

## 2018-02-06 ENCOUNTER — Emergency Department (HOSPITAL_COMMUNITY)
Admission: EM | Admit: 2018-02-06 | Discharge: 2018-02-06 | Disposition: A | Discharge status: Home / Self Care | Payer: Self-pay | Attending: Emergency Medicine | Admitting: Emergency Medicine

## 2018-02-06 DIAGNOSIS — F419 Anxiety disorder, unspecified: Secondary | ICD-10-CM | POA: Insufficient documentation

## 2018-02-06 DIAGNOSIS — R0602 Shortness of breath: Secondary | ICD-10-CM

## 2018-02-06 DIAGNOSIS — Z79899 Other long term (current) drug therapy: Secondary | ICD-10-CM | POA: Insufficient documentation

## 2018-02-06 DIAGNOSIS — J45909 Unspecified asthma, uncomplicated: Secondary | ICD-10-CM | POA: Insufficient documentation

## 2018-02-06 MED ORDER — LORAZEPAM 2 MG/ML IJ SOLN: 1.0000 mg | Freq: Once | INTRAMUSCULAR | Status: DC

## 2018-02-06 MED ORDER — LORAZEPAM 1 MG PO TABS
1.0000 mg | ORAL_TABLET | Freq: Once | ORAL | Status: DC
  Filled 2018-02-06: qty 1

## 2018-02-06 NOTE — ED Notes (Signed)
PA updated that pt states "he is feeling better"

## 2018-02-06 NOTE — ED Provider Notes (Signed)
[facility] Provider Note   CSN: [id number] Arrival date & time: 02/06/18  1311     History   Chief Complaint Chief Complaint  Patient presents with  . Asthma    HPI Jose Hall is a 32 y.o. male who presents for SOB. The patient has been seen 18 times in the last 6 months. [facility] and is currently taking Qvar and oral prednisone.  Patient was seen in the emergency department on 01/28/2018 at which time the patient did go into supraventricular tachycardia.  He has had negative workup for TSH, negative d-dimer, negative chest x-rays, unremarkable EKG.  In December 2018 he was seen in the outpatient setting by cardiology and had a normal echocardiogram and normal Holter monitoring.  Patient states that since he was seen on the fifth he has been waking up daily showering and then coming to sit in the parking lot because he is terrified that he is going to die.  He has not sought any outpatient mental health.  I asked why he is here today and he states because I am afraid there is something wrong with my body and that I am going to die.  He denies orthopnea or PND.  Does feel exertional dyspnea.  HPI  Past Medical History:  Diagnosis Date  . Anxiety   . Asthma     Patient Active Problem List   Diagnosis Date Noted  . History of palpitations 09/09/2017  . Dyspnea on exertion 09/09/2017  . Anxiety about health 09/09/2017  . Suspected exposure to mold 09/09/2017  . History of asthma 12/23/2006    History reviewed. No pertinent surgical history.      Home Medications    Prior to Admission medications   Medication Sig Start Date End Date Taking? Authorizing Provider  albuterol (PROVENTIL HFA;VENTOLIN HFA) 108 (90 Base) MCG/ACT inhaler Inhale 1-2 puffs into the lungs every 6 (six) hours as needed for wheezing or shortness of breath. 01/08/18   Palumbo, April, MD  ALPRAZolam Prudy Feeler) 0.25 MG tablet  Take 1 tablet (0.25 mg total) by mouth at bedtime as needed for anxiety. 12/23/17   Terrilee Files, MD  beclomethasone (QVAR REDIHALER) 80 MCG/ACT inhaler Inhale 2 puffs into the lungs 2 (two) times daily. 01/17/18   Massie Maroon, FNP  cetirizine (ZYRTEC) 10 MG tablet Take 1 tablet (10 mg total) by mouth daily. 12/21/17   Quentin Angst, MD  fluticasone (FLONASE) 50 MCG/ACT nasal spray Place 2 sprays into both nostrils daily. 12/21/17   Quentin Angst, MD  hydrOXYzine (ATARAX/VISTARIL) 25 MG tablet Take 1 tablet (25 mg total) by mouth every 6 (six) hours as needed for up to 5 days for anxiety. 02/01/18 02/06/18  Cristina Gong, PA-C  metoprolol tartrate (LOPRESSOR) 25 MG tablet Take 1 tablet (25 mg total) by mouth 2 (two) times daily. 01/28/18   Rolan Bucco, MD    Family History Family History  Problem Relation Age of Onset  . Hypertension Other   . Diabetes Other   . Asthma Father     Social History Social History   Tobacco Use  . Smoking status: Never Smoker  . Smokeless tobacco: Never Used  Substance Use Topics  . Alcohol use: No    Frequency: Never  . Drug use: No     Allergies   Patient has no known allergies.   Review of Systems Review of Systems  Ten  systems reviewed and are negative for acute change, except as noted in the HPI.   Physical Exam Updated Vital Signs BP (!) 164/103 (BP Location: Left Arm)   Pulse 87   Temp 98.5 F (36.9 C) (Oral)   Resp 18   Ht 5\' 7"  (1.702 m)   Wt 74.8 kg (165 lb)   SpO2 100%   BMI 25.84 kg/m   Physical Exam  Constitutional: He appears well-developed and well-nourished. No distress.  HENT:  Head: Normocephalic and atraumatic.  Eyes: Conjunctivae are normal. No scleral icterus.  Neck: Normal range of motion. Neck supple. No thyromegaly present.  Cardiovascular: Normal rate, regular rhythm and normal heart sounds.  Pulmonary/Chest: Effort normal and breath sounds normal. No stridor. No respiratory  distress. He has no wheezes.  Abdominal: Soft. There is no tenderness.  Musculoskeletal: He exhibits no edema.  Neurological: He is alert.  Skin: Skin is warm and dry. He is not diaphoretic.  Psychiatric:  anxious  Nursing note and vitals reviewed.    ED Treatments / Results  Labs (all labs ordered are listed, but only abnormal results are displayed) Labs Reviewed - No data to display  EKG EKG Interpretation  Date/Time:  Sunday February 06 2018 13:24:59 EDT Ventricular Rate:  102 PR Interval:    QRS Duration: 80 QT Interval:  313 QTC Calculation: 408 R Axis:   92 Text Interpretation:  Sinus tachycardia Borderline right axis deviation Probable anteroseptal infarct, old ST elevation suggests acute pericarditis Confirmed by Vanetta MuldersZackowski, Scott [phone] on 02/06/2018 3:31:26 PM   Radiology No results found.  Procedures Procedures (including critical care time)  Medications Ordered in ED Medications  LORazepam (ATIVAN) tablet 1 mg (has no administration in time range)     Initial Impression / Assessment and Plan / ED Course  I have reviewed the triage vital signs and the nursing notes.  Pertinent labs & imaging results that were available during my care of the patient were reviewed by me and considered in my medical decision making (see chart for details).     Patient has had extensive workup for his chest pain shortness of breath.  He does not appear to be in SVT today and does not have any chest pain.  No thyromegaly or tachycardia at this time.  His EKG is reviewed by myself and by Dr. Gustavus MessingZukowski and although he has some changes Dr. Gustavus MessingZukowski feels that there is nothing significant at this time but needs to be worked up further.  Of concern about his mental health state and have expressed this to the patient considering his rumination and obsession with his health and the fact that he is sitting in the parking lot throughout the day worried that something bad might happen to him.   He understands that this is pathologic behavior.  Stressed return precautions with the patient.  He appears appropriate for discharge at this time.  Patient offered dose of Ativan here and he states that he is feeling better and does not need it.    Final Clinical Impressions(s) / ED Diagnoses   Final diagnoses:  Anxiety  SOB (shortness of breath)    ED Discharge Orders    None       Arthor CaptainHarris, Dia Donate, PA-C 02/06/18 1632    Vanetta MuldersZackowski, Scott, MD 02/08/18 [phone]

## 2018-02-06 NOTE — ED Triage Notes (Signed)
Reports that his breathing is "good now". Denies any cough or chest pains.

## 2018-02-06 NOTE — ED Triage Notes (Signed)
Pt reports that for months been having issues with his asthma. Reports flonas he was given but not helping. Reports been taking his asthma meds as should.

## 2018-02-06 NOTE — Discharge Instructions (Signed)
Contact a health care provider if: °Your symptoms do not improve, or they get worse. °You are not able to take your medicine as prescribed because of side effects. °Get help right away if: °You have serious thoughts about hurting yourself or others. °You have symptoms of a panic attack. Do not drive yourself to the hospital. Have someone else drive you or call an ambulance. °

## 2018-02-07 DIAGNOSIS — Z5321 Procedure and treatment not carried out due to patient leaving prior to being seen by health care provider: Secondary | ICD-10-CM | POA: Insufficient documentation

## 2018-02-07 DIAGNOSIS — R0602 Shortness of breath: Secondary | ICD-10-CM | POA: Insufficient documentation

## 2018-02-07 NOTE — ED Triage Notes (Addendum)
Pt from home with c/o asthma exacerbation. Pt states he was seen for same yesterday. Pt states he does not have an inhaler. Pt has clear lung sounds. Pt states at current time he has no complaints. Pt states he wants a ct of his lungs

## 2018-02-08 ENCOUNTER — Emergency Department (HOSPITAL_COMMUNITY)
Admission: EM | Admit: 2018-02-08 | Discharge: 2018-02-08 | Disposition: A | Discharge status: Home / Self Care | Payer: Self-pay | Attending: Emergency Medicine | Admitting: Emergency Medicine

## 2018-02-08 NOTE — ED Notes (Signed)
Pt called to be taken to treatment room x 1 no answer

## 2018-02-19 ENCOUNTER — Emergency Department (HOSPITAL_COMMUNITY)
Admission: EM | Admit: 2018-02-19 | Discharge: 2018-02-19 | Disposition: A | Discharge status: Home / Self Care | Payer: Self-pay | Attending: Emergency Medicine | Admitting: Emergency Medicine

## 2018-02-19 ENCOUNTER — Encounter (HOSPITAL_COMMUNITY): Payer: Self-pay | Admitting: Emergency Medicine

## 2018-02-19 ENCOUNTER — Other Ambulatory Visit: Payer: Self-pay

## 2018-02-19 DIAGNOSIS — R0981 Nasal congestion: Secondary | ICD-10-CM | POA: Insufficient documentation

## 2018-02-19 DIAGNOSIS — R42 Dizziness and giddiness: Secondary | ICD-10-CM

## 2018-02-19 DIAGNOSIS — J309 Allergic rhinitis, unspecified: Secondary | ICD-10-CM | POA: Insufficient documentation

## 2018-02-19 DIAGNOSIS — J45909 Unspecified asthma, uncomplicated: Secondary | ICD-10-CM | POA: Insufficient documentation

## 2018-02-19 DIAGNOSIS — Z79899 Other long term (current) drug therapy: Secondary | ICD-10-CM | POA: Insufficient documentation

## 2018-02-19 MED ORDER — LORATADINE 10 MG PO TABS: 10.0000 mg | ORAL_TABLET | Freq: Every day | ORAL | 0 refills | Status: DC

## 2018-02-19 NOTE — ED Triage Notes (Signed)
Pt reports intermittent dizziness with fast breathing over last few weeks. Pt reports that he felt a little anxious today but did not take his anxiety medications. Pt reported one episode of shortness of breath while in waiting area. Did not use inhaler.

## 2018-02-19 NOTE — ED Provider Notes (Signed)
[facility] Provider Note   CSN: [id number] Arrival date & time: 02/19/18  1047     History   Chief Complaint Chief Complaint  Patient presents with  . Dizziness    HPI Jose Hall is a 32 y.o. male who presents to the ED with c/o dizziness. The patient was evaluated 02/06/18 for dizziness and shortness of breath. Hx of asthma, anxiety and one episode of SVT. The patient has had 19 ED visits in the past 6 months. Today patient reports he has been breathing fast and then getting dizzy. Patient got up this morning and did not eat and when he started down the road began having symptoms. Patient also reports feeling a little anxious today and one episode of shortness of breath while in the waiting  room. He did not have to use his inhaler. Patient did not take his anxiety medication today. Patient states that he has had nasal congestion and feels like he can't breath through his nose so he takes short quick breaths through his nose. Patient has been having symptoms of shortness of breath for a long time and has an appointment 03/02/18 with the pulmonary doctor. Patient states he is worried something is wrong with him.  HPI  Past Medical History:  Diagnosis Date  . Anxiety   . Asthma     Patient Active Problem List   Diagnosis Date Noted  . History of palpitations 09/09/2017  . Dyspnea on exertion 09/09/2017  . Anxiety about health 09/09/2017  . Suspected exposure to mold 09/09/2017  . History of asthma 12/23/2006    History reviewed. No pertinent surgical history.      Home Medications    Prior to Admission medications   Medication Sig Start Date End Date Taking? Authorizing Provider  albuterol (PROVENTIL HFA;VENTOLIN HFA) 108 (90 Base) MCG/ACT inhaler Inhale 1-2 puffs into the lungs every 6 (six) hours as needed for wheezing or shortness of breath. 01/08/18   Palumbo, April, MD  ALPRAZolam Prudy Feeler) 0.25 MG tablet Take 1 tablet (0.25 mg total)  by mouth at bedtime as needed for anxiety. 12/23/17   Terrilee Files, MD  beclomethasone (QVAR REDIHALER) 80 MCG/ACT inhaler Inhale 2 puffs into the lungs 2 (two) times daily. 01/17/18   Massie Maroon, FNP  cetirizine (ZYRTEC) 10 MG tablet Take 1 tablet (10 mg total) by mouth daily. 12/21/17   Quentin Angst, MD  fluticasone (FLONASE) 50 MCG/ACT nasal spray Place 2 sprays into both nostrils daily. 12/21/17   Quentin Angst, MD  loratadine (CLARITIN) 10 MG tablet Take 1 tablet (10 mg total) by mouth daily. 02/19/18   Janne Napoleon, NP  metoprolol tartrate (LOPRESSOR) 25 MG tablet Take 1 tablet (25 mg total) by mouth 2 (two) times daily. 01/28/18   Rolan Bucco, MD    Family History Family History  Problem Relation Age of Onset  . Hypertension Other   . Diabetes Other   . Asthma Father     Social History Social History   Tobacco Use  . Smoking status: Never Smoker  . Smokeless tobacco: Never Used  Substance Use Topics  . Alcohol use: No    Frequency: Never  . Drug use: No     Allergies   Patient has no known allergies.   Review of Systems Review of Systems  Constitutional: Negative for chills and fever.  HENT: Positive for congestion and sinus pressure.   Eyes: Negative for visual disturbance.  Respiratory: Cough: occasional.  Cardiovascular: Negative for leg swelling.  Gastrointestinal: Negative for abdominal pain and nausea.  Genitourinary: Negative for decreased urine volume.  Musculoskeletal: Negative for neck stiffness.  Skin: Negative for rash.  Neurological: Positive for dizziness. Negative for syncope.  Hematological: Negative for adenopathy.  Psychiatric/Behavioral: Negative for confusion.     Physical Exam Updated Vital Signs BP (!) 137/94 (BP Location: Right Arm)   Pulse 80   Resp 16   Wt 69.9 kg (154 lb)   SpO2 100%   BMI 24.12 kg/m   Physical Exam  Constitutional: He is oriented to person, place, and time. He appears  well-developed and well-nourished. No distress.  HENT:  Head: Normocephalic and atraumatic.  Right Ear: Tympanic membrane normal.  Left Ear: Tympanic membrane normal.  Nose: Rhinorrhea present.  Mouth/Throat: Uvula is midline, oropharynx is clear and moist and mucous membranes are normal.  Allergic shiners.   Eyes: Pupils are equal, round, and reactive to light. Conjunctivae and EOM are normal.  Neck: Normal range of motion. Neck supple.  Cardiovascular: Normal rate and regular rhythm.  Pulmonary/Chest: Effort normal and breath sounds normal. He has no wheezes. He has no rales. He exhibits no tenderness.  Abdominal: Soft. Bowel sounds are normal. There is no tenderness.  Musculoskeletal: Normal range of motion.  Lymphadenopathy:    He has no cervical adenopathy.  Neurological: He is alert and oriented to person, place, and time. He has normal strength. No cranial nerve deficit or sensory deficit. He displays a negative Romberg sign. Gait normal.  Skin: Skin is warm and dry.  Psychiatric: His mood appears anxious.  Nursing note and vitals reviewed.    ED Treatments / Results  Labs (all labs ordered are listed, but only abnormal results are displayed) Labs Reviewed - No data to display  EKG None  Radiology No results found.  Procedures Procedures (including critical care time)  Medications Ordered in ED Medications - No data to display   Initial Impression / Assessment and Plan / ED Course  I have reviewed the triage vital signs and the nursing notes. 32 y.o. male with frequent visits to the ED and has a PCP and is scheduled to see a pulmonologist stable for d/c without respiratory distress and normal neuro exam. Will treat for allergic rhinitis. Since patient has had CXR x 6 in the past 6 months and multiple work ups I did not repeat CXR today since lungs were clear. EKG was done and read by the MD. I discussed this case with Dr. Particia Nearing and she agrees with plan. Encouraged  patient to take his anxiety medication as directed.   Final Clinical Impressions(s) / ED Diagnoses   Final diagnoses:  Nasal congestion  Allergic shiners  Dizziness    ED Discharge Orders        Ordered    loratadine (CLARITIN) 10 MG tablet  Daily     02/19/18 1315       Damian Leavell [place], [place] 02/19/18 1651    Jacalyn Lefevre, MD 02/20/18 [phone]

## 2018-02-19 NOTE — ED Notes (Signed)
Pt declined EKG.

## 2018-02-19 NOTE — Discharge Instructions (Signed)
Be sure you are taking your medications for anxiety as directed. Make an appointment with your doctor for follow up.

## 2018-02-23 ENCOUNTER — Ambulatory Visit: Payer: Self-pay | Admitting: Cardiology

## 2018-02-23 DIAGNOSIS — R0989 Other specified symptoms and signs involving the circulatory and respiratory systems: Secondary | ICD-10-CM

## 2018-02-24 ENCOUNTER — Encounter: Payer: Self-pay | Admitting: *Deleted

## 2018-03-02 ENCOUNTER — Other Ambulatory Visit: Payer: Self-pay

## 2018-03-02 ENCOUNTER — Encounter: Payer: Self-pay | Admitting: Critical Care Medicine

## 2018-03-02 ENCOUNTER — Ambulatory Visit: Payer: Self-pay | Attending: Critical Care Medicine | Admitting: Critical Care Medicine

## 2018-03-02 VITALS — BP 130/89 | HR 93 | Temp 98.4°F | Resp 16 | Ht 67.0 in | Wt 156.0 lb

## 2018-03-02 DIAGNOSIS — F419 Anxiety disorder, unspecified: Secondary | ICD-10-CM | POA: Insufficient documentation

## 2018-03-02 DIAGNOSIS — J4550 Severe persistent asthma, uncomplicated: Secondary | ICD-10-CM | POA: Insufficient documentation

## 2018-03-02 DIAGNOSIS — J4540 Moderate persistent asthma, uncomplicated: Secondary | ICD-10-CM

## 2018-03-02 DIAGNOSIS — J301 Allergic rhinitis due to pollen: Secondary | ICD-10-CM | POA: Insufficient documentation

## 2018-03-02 DIAGNOSIS — Z79899 Other long term (current) drug therapy: Secondary | ICD-10-CM | POA: Insufficient documentation

## 2018-03-02 MED ORDER — AEROCHAMBER MV MISC: 0 refills | Status: DC

## 2018-03-02 MED ORDER — BECLOMETHASONE DIPROP HFA 80 MCG/ACT IN AERB: 2.0000 | INHALATION_SPRAY | Freq: Every day | RESPIRATORY_TRACT | 5 refills | Status: DC

## 2018-03-02 NOTE — Assessment & Plan Note (Signed)
Moderate persistent asthma with rhinitis

## 2018-03-02 NOTE — Progress Notes (Signed)
Subjective:    Patient ID: Jose Hall, male    DOB: 1986/04/07, 32 y.o.   MRN: [id number]  32 y.o.M with severe persistent asthma her for pulmonary evaluation. He has had 16 ED visits in 4 months for anxiety and sensation of throat closure.  Dx asthma as a child.  PTX at age 56.  Did well from age 51 until November 2018  Pt had severe depression and anxiety. As this worsened breathing is worse.  Allergies animal dander, fumes, pollen, mold.   Moved out of a house with mold, left this in November. If on prednisone is helpful. Pt has qvar but not regular.   Asthma  He complains of chest tightness, difficulty breathing, frequent throat clearing and shortness of breath. There is no cough, hemoptysis, hoarse voice or wheezing. This is a chronic problem. The current episode started more than 1 year ago. The problem occurs 2 to 4 times per day. The problem has been rapidly improving. Associated symptoms include chest pain, headaches and nasal congestion. Pertinent negatives include no ear congestion, ear pain, fever, heartburn, malaise/fatigue, myalgias, orthopnea, PND, postnasal drip, rhinorrhea, sneezing, sore throat, sweats, trouble swallowing or weight loss. His symptoms are aggravated by pollen, exposure to fumes, exercise, emotional stress, animal exposure and URI. His symptoms are alleviated by beta-agonist and oral steroids. He reports moderate improvement on treatment. His past medical history is significant for asthma. There is no history of bronchiectasis, bronchitis, emphysema or pneumonia.   Past Medical History:  Diagnosis Date  . Anxiety   . Asthma      Family History  Problem Relation Age of Onset  . Hypertension Other   . Diabetes Other   . Asthma Father      Social History   Socioeconomic History  . Marital status: Single    Spouse name: Not on file  . Number of children: Not on file  . Years of education: Not on file  . Highest education level: Not on file    Occupational History  . Not on file  Social Needs  . Financial resource strain: Not on file  . Food insecurity:    Worry: Not on file    Inability: Not on file  . Transportation needs:    Medical: Not on file    Non-medical: Not on file  Tobacco Use  . Smoking status: Never Smoker  . Smokeless tobacco: Never Used  Substance and Sexual Activity  . Alcohol use: No    Frequency: Never  . Drug use: No  . Sexual activity: Yes  Lifestyle  . Physical activity:    Days per week: Not on file    Minutes per session: Not on file  . Stress: Not on file  Relationships  . Social connections:    Talks on phone: Not on file    Gets together: Not on file    Attends religious service: Not on file    Active member of club or organization: Not on file    Attends meetings of clubs or organizations: Not on file    Relationship status: Not on file  . Intimate partner violence:    Fear of current or ex partner: Not on file    Emotionally abused: Not on file    Physically abused: Not on file    Forced sexual activity: Not on file  Other Topics Concern  . Not on file  Social History Narrative   ** Merged History Encounter **  No Known Allergies   Outpatient Medications Prior to Visit  Medication Sig Dispense Refill  . albuterol (PROVENTIL HFA;VENTOLIN HFA) 108 (90 Base) MCG/ACT inhaler Inhale 1-2 puffs into the lungs every 6 (six) hours as needed for wheezing or shortness of breath. 1 Inhaler 0  . ALPRAZolam (XANAX) 0.25 MG tablet Take 1 tablet (0.25 mg total) by mouth at bedtime as needed for anxiety. 10 tablet 0  . beclomethasone (QVAR REDIHALER) 80 MCG/ACT inhaler Inhale 2 puffs into the lungs 2 (two) times daily. 1 Inhaler 5  . fluticasone (FLONASE) 50 MCG/ACT nasal spray Place 2 sprays into both nostrils daily. 16 g 6  . loratadine (CLARITIN) 10 MG tablet Take 1 tablet (10 mg total) by mouth daily. 14 tablet 0  . beclomethasone (QVAR) 80 MCG/ACT inhaler Inhale 4 puffs into the  lungs 2 (two) times daily.    . metoprolol tartrate (LOPRESSOR) 25 MG tablet Take 1 tablet (25 mg total) by mouth 2 (two) times daily. (Patient not taking: Reported on 03/02/2018) 60 tablet 0  . cetirizine (ZYRTEC) 10 MG tablet Take 1 tablet (10 mg total) by mouth daily. (Patient not taking: Reported on 03/02/2018) 30 tablet 11   No facility-administered medications prior to visit.       Review of Systems  Constitutional: Negative for fever, malaise/fatigue and weight loss.  HENT: Negative for ear pain, hoarse voice, postnasal drip, rhinorrhea, sneezing, sore throat and trouble swallowing.   Respiratory: Positive for shortness of breath. Negative for cough, hemoptysis and wheezing.   Cardiovascular: Positive for chest pain. Negative for PND.  Gastrointestinal: Negative for heartburn.  Musculoskeletal: Negative for myalgias.  Neurological: Positive for headaches.       Objective:   Physical Exam Vitals:   03/02/18 0905 03/02/18 0944  BP: (!) 146/95 130/89  Pulse: 93   Resp: 16   Temp: 98.4 F (36.9 C)   TempSrc: Oral   SpO2: 100%   Weight: 156 lb (70.8 kg)     Gen: Pleasant, well-nourished, in no distress,  normal affect  ENT: No lesions,  mouth clear,  oropharynx clear, ++ postnasal drip, swollen nasal turbinates  Neck: No JVD, no TMG, no carotid bruits  Lungs: No use of accessory muscles, no dullness to percussion, clear without rales or rhonchi  Cardiovascular: RRR, heart sounds normal, no murmur or gallops, no peripheral edema  Abdomen: soft and NT, no HSM,  BS normal  Musculoskeletal: No deformities, no cyanosis or clubbing  Neuro: alert, non focal  Skin: Warm, no lesions or rashes  No results found. CXR NAD  all recent labs normal       Assessment & Plan:  I personally reviewed all images and lab data in the [facility] system as well as any outside material available during this office visit and agree with the  radiology impressions.   Asthma, moderate  persistent Moderate persistent asthma.  Needs to work on [facility] technique and needs a spacer Plan aerochamber Use Qvar two puff daily Prn albuterol Spirometry Check IgE level and mold assay Return may 22  Rhinitis due to pollen Moderate persistent asthma with rhinitis   Dallen was seen today for asthma.  Diagnoses and all orders for this visit:  Moderate persistent asthma without complication -     beclomethasone (QVAR REDIHALER) 80 MCG/ACT inhaler; Inhale 2 puffs into the lungs daily. -     Spirometry with graph; Future -     IgE -     Hypersensitivity pnuemonitis profile  Non-seasonal allergic  rhinitis due to pollen -     IgE -     Hypersensitivity pnuemonitis profile  Other orders -     Spacer/Aero-Holding Chambers (AEROCHAMBER MV) inhaler; Use as instructed

## 2018-03-02 NOTE — Progress Notes (Signed)
Pt states he still has days of intermittent chest tightness but noticed improvement.

## 2018-03-02 NOTE — Assessment & Plan Note (Addendum)
Moderate persistent asthma.  Needs to work on [facility] technique and needs a spacer Plan aerochamber Use Qvar two puff daily Prn albuterol Spirometry Check IgE level and mold assay Return may 22

## 2018-03-02 NOTE — Patient Instructions (Addendum)
Use Qvar two puff daily, use spacer Use albuterol as needed 2 puff every 6 hours Blood work today Stay on [employer] daily two puff each nostril Stay on clariten daily  A breathing test will be obtained Return in June 12

## 2018-03-10 LAB — HYPERSENSITIVITY PNUEMONITIS PROFILE
A fumigatus #2: NEGATIVE
A. Pullulans Abs: NEGATIVE
Aspergillus Flavus Antibodies: NEGATIVE
Aspergillus Niger Antibodies: NEGATIVE
Micropolyspora faeni, IgG: NEGATIVE
Pigeon Serum Abs: NEGATIVE
Thermoactinomyces vulgaris, IgG: NEGATIVE

## 2018-03-10 LAB — IGE: IgE (Immunoglobulin E), Serum: 194 IU/mL (ref 6–495)

## 2018-03-11 ENCOUNTER — Telehealth: Payer: Self-pay | Admitting: *Deleted

## 2018-03-11 NOTE — Telephone Encounter (Signed)
Patient verified DOB MA reiterated the normal results. Patient advised to continue with allergy treatment/ medications and follow up as planned.

## 2018-03-11 NOTE — Telephone Encounter (Signed)
Medical Assistant left message on patient's home and cell voicemail. Voicemail states to give a call back to [place] with [facility] at [phone]. !!!Please inform patient of mold/allergy study being normal!!!

## 2018-03-11 NOTE — Telephone Encounter (Signed)
-----   Message from Storm Frisk, MD sent at 03/10/2018  1:45 PM EDT ----- Eustace Pen,  Please call the patient and let him know his mold /allergy study is normal

## 2018-03-17 ENCOUNTER — Inpatient Hospital Stay (HOSPITAL_COMMUNITY): Admission: RE | Admit: 2018-03-17 | Payer: Self-pay | Source: Ambulatory Visit

## 2018-03-22 ENCOUNTER — Ambulatory Visit: Payer: Self-pay | Admitting: Internal Medicine

## 2018-04-02 ENCOUNTER — Emergency Department (HOSPITAL_COMMUNITY)
Admission: EM | Admit: 2018-04-02 | Discharge: 2018-04-02 | Disposition: A | Discharge status: Home / Self Care | Payer: Self-pay | Attending: Emergency Medicine | Admitting: Emergency Medicine

## 2018-04-02 ENCOUNTER — Encounter (HOSPITAL_COMMUNITY): Payer: Self-pay | Admitting: Emergency Medicine

## 2018-04-02 ENCOUNTER — Emergency Department (HOSPITAL_COMMUNITY): Payer: Self-pay

## 2018-04-02 DIAGNOSIS — Z79899 Other long term (current) drug therapy: Secondary | ICD-10-CM | POA: Insufficient documentation

## 2018-04-02 DIAGNOSIS — R0602 Shortness of breath: Secondary | ICD-10-CM | POA: Insufficient documentation

## 2018-04-02 DIAGNOSIS — R069 Unspecified abnormalities of breathing: Secondary | ICD-10-CM

## 2018-04-02 DIAGNOSIS — F419 Anxiety disorder, unspecified: Secondary | ICD-10-CM | POA: Insufficient documentation

## 2018-04-02 DIAGNOSIS — J45909 Unspecified asthma, uncomplicated: Secondary | ICD-10-CM | POA: Insufficient documentation

## 2018-04-02 MED ORDER — IPRATROPIUM-ALBUTEROL 0.5-2.5 (3) MG/3ML IN SOLN: 3.0000 mL | Freq: Once | RESPIRATORY_TRACT | Status: DC

## 2018-04-02 NOTE — ED Notes (Signed)
Pt visitor requested a cup pf water for patient. Visitor made aware that we would be glad to get him some water as soon as the patient has been seen by the provider.

## 2018-04-02 NOTE — Discharge Instructions (Addendum)
Please follow up with your primary care provider within 5-7 days for re-evaluation of your symptoms. If you do not have a primary care provider, information for a healthcare clinic has been provided for you to make arrangements for follow up care. Please return to the emergency department for any new or worsening symptoms.  Please return immediately if you have any chest pain, shortness of breath, pain with breathing, thoughts of harming yourself, thoughts of harming others.

## 2018-04-02 NOTE — ED Provider Notes (Signed)
[facility]-EMERGENCY DEPT Provider Note   CSN: [id number] Arrival date & time: 04/02/18  1359     History   Chief Complaint Chief Complaint  Patient presents with  . Shortness of Breath    HPI Margit HanksLamar Q Capozzoli is a 32 y.o. male.  HPI   Patient is a 32 year old male with history of anxiety, asthma who presents to the emergency department today to be evaluated for abnormal breathing pattern that has been present for the last month.  He states that for the last month he has had episodes where he feels like "when you cry and have to do extra breathing to get a breath".  States he has about 3 breaths like this and then his symptoms resolved.  He denies feeling short of breath or having trouble getting air in.  No chest pain.  No pain with inspiration.  He states that he notices his symptoms are worse when he does not take his Xanax.  He states that his symptoms go away when he does take Xanax.  He is not on any other antianxiety medication.  And he feels that he is in the anxiety is not well controlled with this medication.  He feels that his symptoms are related to his anxiety.  He reports some nasal congestion and rhinorrhea but otherwise denies any wheezing, cough, ear pain, sore throat, or other URI symptoms.  Denies any headaches lightheadedness, fevers, abdominal pain, nausea or vomiting. Denies leg pain/swelling, hemoptysis, recent surgery/trauma, recent long travel, hormone use, personal hx of cancer, or hx of DVT/PE.  Denies SI, HI, or AVH.   Past Medical History:  Diagnosis Date  . Anxiety   . Asthma     Patient Active Problem List   Diagnosis Date Noted  . Rhinitis due to pollen 03/02/2018  . History of palpitations 09/09/2017  . Anxiety about health 09/09/2017  . Suspected exposure to mold 09/09/2017  . Asthma, moderate persistent 12/23/2006    History reviewed. No pertinent surgical history.    Home Medications    Prior to Admission medications     Medication Sig Start Date End Date Taking? Authorizing Provider  albuterol (PROVENTIL HFA;VENTOLIN HFA) 108 (90 Base) MCG/ACT inhaler Inhale 1-2 puffs into the lungs every 6 (six) hours as needed for wheezing or shortness of breath. 01/08/18  Yes Palumbo, April, MD  ALPRAZolam Prudy Feeler(XANAX) 0.25 MG tablet Take 1 tablet (0.25 mg total) by mouth at bedtime as needed for anxiety. 12/23/17  Yes Terrilee FilesButler, Michael C, MD  fluticasone [facility](FLONASE) 50 MCG/ACT nasal spray Place 2 sprays into both nostrils daily. Patient taking differently: Place 2 sprays into both nostrils as needed for allergies.  12/21/17  Yes Quentin AngstJegede, Olugbemiga E, MD  loratadine (CLARITIN) 10 MG tablet Take 1 tablet (10 mg total) by mouth daily. 02/19/18  Yes Neese, Bridgette HabermannHope M, NP  Multiple Vitamin (MULTIVITAMIN WITH MINERALS) TABS tablet Take 1 tablet by mouth daily.   Yes [provider]  beclomethasone (QVAR REDIHALER) 80 MCG/ACT inhaler Inhale 2 puffs into the lungs daily. Patient not taking: Reported on 04/02/2018 03/02/18   Storm FriskWright, Patrick E, MD  metoprolol tartrate (LOPRESSOR) 25 MG tablet Take 1 tablet (25 mg total) by mouth 2 (two) times daily. Patient not taking: Reported on 03/02/2018 01/28/18   Rolan BuccoBelfi, Melanie, MD  Spacer/Aero-Holding Chambers (AEROCHAMBER MV) inhaler Use as instructed Patient not taking: Reported on 04/02/2018 03/02/18   Storm FriskWright, Patrick E, MD    Family History Family History  Problem Relation Age of  Onset  . Hypertension Other   . Diabetes Other   . Asthma Father     Social History Social History   Tobacco Use  . Smoking status: Never Smoker  . Smokeless tobacco: Never Used  Substance Use Topics  . Alcohol use: No    Frequency: Never  . Drug use: No     Allergies   Patient has no known allergies.   Review of Systems Review of Systems  Constitutional: Negative for fever.  HENT: Positive for congestion and rhinorrhea. Negative for ear pain, sinus pressure, sinus pain and sore throat.   Eyes: Negative for  visual disturbance.  Respiratory: Negative for cough, shortness of breath and wheezing.        Abnormal breathing pattern  Cardiovascular: Negative for chest pain and leg swelling.  Gastrointestinal: Negative for abdominal pain, constipation, diarrhea, nausea and vomiting.  Genitourinary: Negative for flank pain.  Musculoskeletal: Negative for back pain.  Skin: Negative for wound.  Neurological: Negative for dizziness, weakness, light-headedness, numbness and headaches.     Physical Exam Updated Vital Signs BP (!) 138/102 (BP Location: Left Arm)   Pulse 71   Temp 98.4 F (36.9 C) (Oral)   Resp 16   Ht 5\' 7"  (1.702 m)   Wt 72.6 kg (160 lb)   SpO2 100%   BMI 25.06 kg/m   Physical Exam  Constitutional: He appears well-developed and well-nourished.  Non-toxic appearance. He does not appear ill. No distress.  HENT:  Head: Normocephalic and atraumatic.  Mouth/Throat: Oropharynx is clear and moist.  Eyes: Pupils are equal, round, and reactive to light. Conjunctivae and EOM are normal.  Neck: Normal range of motion. Neck supple.  Cardiovascular: Normal rate, regular rhythm and normal heart sounds.  No murmur heard. Pulmonary/Chest: Effort normal and breath sounds normal. No respiratory distress. He has no decreased breath sounds. He has no wheezes. He has no rhonchi. He has no rales.  Patient speaking in full sentences.  No tachypnea.  No respiratory distress.  Satting at 99 to 100% throughout entire history taking.  Heart rate 70s to 80s throughout entire history taking.  Abdominal: Soft. Bowel sounds are normal. He exhibits no distension and no mass. There is no tenderness. There is no guarding.  Musculoskeletal: He exhibits no edema.       Right lower leg: Normal. He exhibits no tenderness and no edema.       Left lower leg: Normal. He exhibits no tenderness and no edema.  Neurological: He is alert.  Skin: Skin is warm and dry. Capillary refill takes less than 2 seconds.    Psychiatric: His mood appears anxious.  Nursing note and vitals reviewed.   ED Treatments / Results  Labs (all labs ordered are listed, but only abnormal results are displayed) Labs Reviewed - No data to display  EKG EKG Interpretation  Date/Time:  Saturday April 02 2018 16:50:09 EDT Ventricular Rate:  75 PR Interval:    QRS Duration: 90 QT Interval:  376 QTC Calculation: 420 R Axis:   102 Text Interpretation:  Sinus rhythm Right axis deviation ST elevation suggests acute pericarditis When compared to prior, no significant changes seen.  NO STEMI Confirmed by Theda Belfast ([id number]) on 04/02/2018 5:19:57 PM   Radiology Dg Chest 2 View  Result Date: 04/02/2018 CLINICAL DATA:  Shortness of breath. EXAM: CHEST - 2 VIEW COMPARISON:  Two-view chest x-ray 01/28/2018 FINDINGS: The heart size and mediastinal contours are within normal limits. Both lungs are clear. The visualized  skeletal structures are unremarkable. IMPRESSION: Negative two view chest x-ray Electronically Signed   By: Marin Roberts M.D.   On: 04/02/2018 16:21   Procedures Procedures (including critical care time)  Medications Ordered in ED Medications - No data to display   Initial Impression / Assessment and Plan / ED Course  I have reviewed the triage vital signs and the nursing notes.  Pertinent labs & imaging results that were available during my care of the patient were reviewed by me and considered in my medical decision making (see chart for details).  On reevaluation patient is sitting in chair in no acute distress.  No tachypnea noted.  Heart rate 74 on monitor.  Satting at 100% on room air.  Still appears anxious.  Still continues to deny shortness of breath or chest pain.  Final Clinical Impressions(s) / ED Diagnoses   Final diagnoses:  Unspecified abnormalities of breathing  Anxiety   Patient presenting with abnormal breathing pattern for the last month.  States "it feels like I need to catch my  breath before I am about to cry".  Lasts for several seconds at a time and for several breast and then resolves spontaneously.  Better when he takes Xanax, worse if he misses his Xanax.  Also worse when he starts feeling anxious.  No fevers.  Satting at 100% on room air.  Normal respirations.  No tachypnea.  Lungs are clear.  Initial heart rate documented at 100, however on all of my reevaluation's patient's heart rate has been between 60 and 80 on the monitor.  His cardiopulmonary exam is completely benign.  His lungs are clear he has good air movement bilaterally.  His chest x-ray shows no evidence of pneumonia, pneumothorax, widened mediastinum, or any other abnormality.  His EKG is consistent with prior.  No ST changes to represent ischemia.  No ST elevation MI.  Heart rate within normal limits.  Do not suspect PE in this patient as he has no risk factors and no symptoms as outlined in the HPI.  Do not suspect other acute cardiopulmonary process at this time.  Patient and myself feel that his symptoms are related to his anxiety.  He is denying SI, HI, AVH.  He has good follow-up with primary care and agrees to follow-up with them in regards to initiating potential therapies for his uncontrolled anxiety.  He is only on Xanax as needed currently.  He agrees to follow-up with PCP.  Also gave resources for [facility] and outpatient counseling as well as resources for stress reduction.  Strict return precautions were discussed for chest pain, shortness of breath, SI, HI, AVH, or other new or worsening symptoms.  Patient voiced understanding and agrees with the plan.  All questions were answered.  ED Discharge Orders    None       Rayne Du 04/02/18 1732    Tegeler, Canary Brim, MD 04/02/18 [phone]

## 2018-04-02 NOTE — ED Triage Notes (Signed)
Pt reports for months now has difficulty breathing like "when you cry and have to do extra breathing to get a breath" pt has hx asthma and anxiety. Pt able to speak in full sentences and appears in NAD. O2 100% on room air.

## 2018-04-05 ENCOUNTER — Ambulatory Visit (INDEPENDENT_AMBULATORY_CARE_PROVIDER_SITE_OTHER): Payer: Self-pay | Admitting: Internal Medicine

## 2018-04-05 ENCOUNTER — Encounter: Payer: Self-pay | Admitting: Internal Medicine

## 2018-04-05 VITALS — BP 136/90 | HR 85 | Temp 99.0°F | Resp 18 | Ht 67.0 in | Wt 150.0 lb

## 2018-04-05 DIAGNOSIS — R0602 Shortness of breath: Secondary | ICD-10-CM

## 2018-04-05 DIAGNOSIS — F411 Generalized anxiety disorder: Secondary | ICD-10-CM | POA: Insufficient documentation

## 2018-04-05 MED ORDER — HYDROXYZINE HCL 25 MG PO TABS: 25.0000 mg | ORAL_TABLET | Freq: Three times a day (TID) | ORAL | 3 refills | Status: DC | PRN

## 2018-04-05 MED ORDER — CLONAZEPAM 0.5 MG PO TABS: 0.5000 mg | ORAL_TABLET | Freq: Two times a day (BID) | ORAL | 1 refills | Status: DC | PRN

## 2018-04-05 NOTE — Progress Notes (Signed)
Jose Hall, is a 32 y.o. male  ZOX:[id number]  [id number]  DOB - Jan 18, 1986  Chief Complaint  Patient presents with  . Follow-up  . Asthma      Subjective:   Jose Hall is a 32 y.o. male with history of anxiety and asthma who presents here today with his fiance for an ED visit follow up visit. Patient was seen in the ED 3 days ago for evaluation of abnormal breathing pattern for over one month. He described the breathing pattern to be like that of extra breathing after a crying episode. He denies outright SOB or chest pain. He is worried so much that something really bad will happen to him and he does not know how to stop this feeling. He claims that Xanax helps occasionally. Today, no symptom is related to his asthma. No wheezing, no chest tightness, no difficulty breathing. He denies any headache, lightheadedness, fevers, abdominal pain, nausea or vomiting. He cannot remember any event that provokes his anxiety, he does not remember any remote event that may have precipitated it or contributing to its flare up. He denies any suicidal ideation or thoughts.   Problem  Generalized Anxiety Disorder  Sob (Shortness of Breath)    ALLERGIES: No Known Allergies  PAST MEDICAL HISTORY: Past Medical History:  Diagnosis Date  . Anxiety   . Asthma     MEDICATIONS AT HOME: Prior to Admission medications   Medication Sig Start Date End Date Taking? Authorizing Provider  albuterol (PROVENTIL HFA;VENTOLIN HFA) 108 (90 Base) MCG/ACT inhaler Inhale 1-2 puffs into the lungs every 6 (six) hours as needed for wheezing or shortness of breath. 01/08/18  Yes Palumbo, April, MD  loratadine (CLARITIN) 10 MG tablet Take 1 tablet (10 mg total) by mouth daily. 02/19/18  Yes Neese, Bridgette Habermann M, NP  Multiple Vitamin (MULTIVITAMIN WITH MINERALS) TABS tablet Take 1 tablet by mouth daily.   Yes [provider]  beclomethasone (QVAR REDIHALER) 80 MCG/ACT inhaler Inhale 2 puffs into the lungs  daily. Patient not taking: Reported on 04/02/2018 03/02/18   Storm Frisk, MD  clonazePAM (KLONOPIN) 0.5 MG tablet Take 1 tablet (0.5 mg total) by mouth 2 (two) times daily as needed for anxiety. 04/05/18   Quentin Angst, MD  fluticasone (FLONASE) 50 MCG/ACT nasal spray Place 2 sprays into both nostrils daily. Patient not taking: Reported on 04/05/2018 12/21/17   Quentin Angst, MD  hydrOXYzine (ATARAX/VISTARIL) 25 MG tablet Take 1 tablet (25 mg total) by mouth 3 (three) times daily as needed. 04/05/18   Quentin Angst, MD  metoprolol tartrate (LOPRESSOR) 25 MG tablet Take 1 tablet (25 mg total) by mouth 2 (two) times daily. Patient not taking: Reported on 03/02/2018 01/28/18   Rolan Bucco, MD  Spacer/Aero-Holding Chambers (AEROCHAMBER MV) inhaler Use as instructed Patient not taking: Reported on 04/02/2018 03/02/18   Storm Frisk, MD    Objective:   Vitals:   04/05/18 1025  BP: 136/90  Pulse: 85  Resp: 18  Temp: 99 F (37.2 C)  TempSrc: Oral  SpO2: 100%  Weight: 150 lb (68 kg)  Height: 5\' 7"  (1.702 m)   Exam General appearance : Awake, alert, not in any distress. Speech Clear. Not toxic looking HEENT: Atraumatic and Normocephalic, pupils equally reactive to light and accomodation Neck: Supple, no JVD. No cervical lymphadenopathy.  Chest: Good air entry bilaterally, no added sounds  CVS: S1 S2 regular, no murmurs.  Abdomen: Bowel sounds present, Non tender and not distended with  no gaurding, rigidity or rebound. Extremities: B/L Lower Ext shows no edema, both legs are warm to touch Neurology: Awake alert, and oriented X 3, CN II-XII intact, Non focal Skin: No Rash  Data Review No results found for: HGBA1C  Assessment & Plan   1. Generalized anxiety disorder  - Start Klonopin 0.5 mg BID  - Start Hydroxyzine 25 mg 3x daily - Deep breathing exercise - Ambulatory referral to Psychology for CBT  2. SOB (shortness of breath)  - Ambulatory referral to  Psychology  Patient have been counseled extensively about nutrition and exercise. Other issues discussed during this visit include: low cholesterol diet, weight control and daily exercise, importance of adherence with medications and regular follow-up.   Return in about 3 months (around 07/06/2018) for Depression and Anxiety.  The patient was given clear instructions to go to ER or return to medical center if symptoms don't improve, worsen or new problems develop. The patient verbalized understanding. The patient was told to call to get lab results if they haven't heard anything in the next week.   This note has been created with Education officer, environmentalDragon speech recognition software and smart phrase technology. Any transcriptional errors are unintentional.    Jeanann Lewandowskylugbemiga Jerson Furukawa, MD, MHA, Maxwell CaulFACP, FAAP, CPE [facility] [place], [place] [phone]   04/05/2018, 11:38 AM

## 2018-04-05 NOTE — Patient Instructions (Signed)

## 2018-04-06 ENCOUNTER — Ambulatory Visit: Payer: Self-pay | Admitting: Critical Care Medicine

## 2018-05-24 DIAGNOSIS — J4520 Mild intermittent asthma, uncomplicated: Secondary | ICD-10-CM | POA: Insufficient documentation

## 2018-06-03 ENCOUNTER — Other Ambulatory Visit: Payer: Self-pay | Admitting: Internal Medicine

## 2018-06-03 ENCOUNTER — Telehealth: Payer: Self-pay

## 2018-06-03 MED ORDER — CLONAZEPAM 0.5 MG PO TABS: 0.5000 mg | ORAL_TABLET | Freq: Two times a day (BID) | ORAL | 1 refills | Status: DC | PRN

## 2018-06-03 NOTE — Telephone Encounter (Signed)
Refilled

## 2018-06-04 ENCOUNTER — Other Ambulatory Visit: Payer: Self-pay | Admitting: Internal Medicine

## 2018-06-06 ENCOUNTER — Telehealth: Payer: Self-pay

## 2018-06-06 NOTE — Telephone Encounter (Signed)
Spoke with pharmacy. Medication is filled and ready for pick up. I called and advised patient of this. Thanks!

## 2018-06-10 ENCOUNTER — Other Ambulatory Visit: Payer: Self-pay

## 2018-06-13 ENCOUNTER — Other Ambulatory Visit: Payer: Self-pay | Admitting: Internal Medicine

## 2018-07-12 ENCOUNTER — Encounter: Payer: Self-pay | Admitting: Internal Medicine

## 2018-07-12 ENCOUNTER — Ambulatory Visit (INDEPENDENT_AMBULATORY_CARE_PROVIDER_SITE_OTHER): Payer: Self-pay | Admitting: Internal Medicine

## 2018-07-12 VITALS — BP 123/80 | HR 93 | Temp 98.5°F | Resp 16 | Ht 67.0 in | Wt 162.0 lb

## 2018-07-12 DIAGNOSIS — R030 Elevated blood-pressure reading, without diagnosis of hypertension: Secondary | ICD-10-CM | POA: Insufficient documentation

## 2018-07-12 DIAGNOSIS — J4540 Moderate persistent asthma, uncomplicated: Secondary | ICD-10-CM

## 2018-07-12 DIAGNOSIS — F411 Generalized anxiety disorder: Secondary | ICD-10-CM

## 2018-07-12 NOTE — Patient Instructions (Signed)
 Generalized Anxiety Disorder, Adult  Generalized anxiety disorder (GAD) is a mental health disorder. People with this condition constantly worry about everyday events. Unlike normal anxiety, worry related to GAD is not triggered by a specific event. These worries also do not fade or get better with time. GAD interferes with life functions, including relationships, work, and school. GAD can vary from mild to severe. People with severe GAD can have intense waves of anxiety with physical symptoms (panic attacks). What are the causes? The exact cause of GAD is not known. What increases the risk? This condition is more likely to develop in:  Women.  People who have a family history of anxiety disorders.  People who are very shy.  People who experience very stressful life events, such as the death of a loved one.  People who have a very stressful family environment.  What are the signs or symptoms? People with GAD often worry excessively about many things in their lives, such as their health and family. They may also be overly concerned about:  Doing well at work.  Being on time.  Natural disasters.  Friendships.  Physical symptoms of GAD include:  Fatigue.  Muscle tension or having muscle twitches.  Trembling or feeling shaky.  Being easily startled.  Feeling like your heart is pounding or racing.  Feeling out of breath or like you cannot take a deep breath.  Having trouble falling asleep or staying asleep.  Sweating.  Nausea, diarrhea, or irritable bowel syndrome (IBS).  Headaches.  Trouble concentrating or remembering facts.  Restlessness.  Irritability.  How is this diagnosed? Your health care provider can diagnose GAD based on your symptoms and medical history. You will also have a physical exam. The health care provider will ask specific questions about your symptoms, including how severe they are, when they started, and if they come and go. Your health  care provider may ask you about your use of alcohol or drugs, including prescription medicines. Your health care provider may refer you to a mental health specialist for further evaluation. Your health care provider will do a thorough examination and may perform additional tests to rule out other possible causes of your symptoms. To be diagnosed with GAD, a person must have anxiety that:  Is out of his or her control.  Affects several different aspects of his or her life, such as work and relationships.  Causes distress that makes him or her unable to take part in normal activities.  Includes at least three physical symptoms of GAD, such as restlessness, fatigue, trouble concentrating, irritability, muscle tension, or sleep problems.  Before your health care provider can confirm a diagnosis of GAD, these symptoms must be present more days than they are not, and they must last for six months or longer. How is this treated? The following therapies are usually used to treat GAD:  Medicine. Antidepressant medicine is usually prescribed for long-term daily control. Antianxiety medicines may be added in severe cases, especially when panic attacks occur.  Talk therapy (psychotherapy). Certain types of talk therapy can be helpful in treating GAD by providing support, education, and guidance. Options include: ? Cognitive behavioral therapy (CBT). People learn coping skills and techniques to ease their anxiety. They learn to identify unrealistic or negative thoughts and behaviors and to replace them with positive ones. ? Acceptance and commitment therapy (ACT). This treatment teaches people how to be mindful as a way to cope with unwanted thoughts and feelings. ? Biofeedback. This process trains   you to manage your body's response (physiological response) through breathing techniques and relaxation methods. You will work with a therapist while machines are used to monitor your physical symptoms.  Stress  management techniques. These include yoga, meditation, and exercise.  A mental health specialist can help determine which treatment is best for you. Some people see improvement with one type of therapy. However, other people require a combination of therapies. Follow these instructions at home:  Take over-the-counter and prescription medicines only as told by your health care provider.  Try to maintain a normal routine.  Try to anticipate stressful situations and allow extra time to manage them.  Practice any stress management or self-calming techniques as taught by your health care provider.  Do not punish yourself for setbacks or for not making progress.  Try to recognize your accomplishments, even if they are small.  Keep all follow-up visits as told by your health care provider. This is important. Contact a health care provider if:  Your symptoms do not get better.  Your symptoms get worse.  You have signs of depression, such as: ? A persistently sad, cranky, or irritable mood. ? Loss of enjoyment in activities that used to bring you joy. ? Change in weight or eating. ? Changes in sleeping habits. ? Avoiding friends or family members. ? Loss of energy for normal tasks. ? Feelings of guilt or worthlessness. Get help right away if:  You have serious thoughts about hurting yourself or others. If you ever feel like you may hurt yourself or others, or have thoughts about taking your own life, get help right away. You can go to your nearest emergency department or call:  Your local emergency services (911 in the [place]  A suicide crisis helpline, such as the National Suicide Prevention Lifeline at 1-800-273-8255. This is open 24 hours a day.  Summary  Generalized anxiety disorder (GAD) is a mental health disorder that involves worry that is not triggered by a specific event.  People with GAD often worry excessively about many things in their lives, such as their health and  family.  GAD may cause physical symptoms such as restlessness, trouble concentrating, sleep problems, frequent sweating, nausea, diarrhea, headaches, and trembling or muscle twitching.  A mental health specialist can help determine which treatment is best for you. Some people see improvement with one type of therapy. However, other people require a combination of therapies. This information is not intended to replace advice given to you by your health care provider. Make sure you discuss any questions you have with your health care provider. Document Released: 02/06/2013 Document Revised: 09/01/2016 Document Reviewed: 09/01/2016 Elsevier Interactive Patient Education  2018 Elsevier Inc[employer]      Living With Anxiety  After being diagnosed with an anxiety disorder, you may be relieved to know why you have felt or behaved a certain way. It is natural to also feel overwhelmed about the treatment ahead and what it will mean for your life. With care and support, you can manage this condition and recover from it. How to cope with anxiety Dealing with stress Stress is your body's reaction to life changes and events, both good and bad. Stress can last just a few hours or it can be ongoing. Stress can play a major role in anxiety, so it is important to learn both how to cope with stress and how to think about it differently. Talk with your health care provider or a counselor to learn more about stress reduction. He   or she may suggest some stress reduction techniques, such as:  Music therapy. This can include creating or listening to music that you enjoy and that inspires you.  Mindfulness-based meditation. This involves being aware of your normal breaths, rather than trying to control your breathing. It can be done while sitting or walking.  Centering prayer. This is a kind of meditation that involves focusing on a word, phrase, or sacred image that is meaningful to you and that brings you peace.  Deep  breathing. To do this, expand your stomach and inhale slowly through your nose. Hold your breath for 3-5 seconds. Then exhale slowly, allowing your stomach muscles to relax.  Self-talk. This is a skill where you identify thought patterns that lead to anxiety reactions and correct those thoughts.  Muscle relaxation. This involves tensing muscles then relaxing them.  Choose a stress reduction technique that fits your lifestyle and personality. Stress reduction techniques take time and practice. Set aside 5-15 minutes a day to do them. Therapists can offer training in these techniques. The training may be covered by some insurance plans. Other things you can do to manage stress include:  Keeping a stress diary. This can help you learn what triggers your stress and ways to control your response.  Thinking about how you respond to certain situations. You may not be able to control everything, but you can control your reaction.  Making time for activities that help you relax, and not feeling guilty about spending your time in this way.  Therapy combined with coping and stress-reduction skills provides the best chance for successful treatment. Medicines Medicines can help ease symptoms. Medicines for anxiety include:  Anti-anxiety drugs.  Antidepressants.  Beta-blockers.  Medicines may be used as the main treatment for anxiety disorder, along with therapy, or if other treatments are not working. Medicines should be prescribed by a health care provider. Relationships Relationships can play a big part in helping you recover. Try to spend more time connecting with trusted friends and family members. Consider going to couples counseling, taking family education classes, or going to family therapy. Therapy can help you and others better understand the condition. How to recognize changes in your condition Everyone has a different response to treatment for anxiety. Recovery from anxiety happens when  symptoms decrease and stop interfering with your daily activities at home or work. This may mean that you will start to:  Have better concentration and focus.  Sleep better.  Be less irritable.  Have more energy.  Have improved memory.  It is important to recognize when your condition is getting worse. Contact your health care provider if your symptoms interfere with home or work and you do not feel like your condition is improving. Where to find help and support: You can get help and support from these sources:  Self-help groups.  Online and community organizations.  A trusted spiritual leader.  Couples counseling.  Family education classes.  Family therapy.  Follow these instructions at home:  Eat a healthy diet that includes plenty of vegetables, fruits, whole grains, low-fat dairy products, and lean protein. Do not eat a lot of foods that are high in solid fats, added sugars, or salt.  Exercise. Most adults should do the following: ? Exercise for at least 150 minutes each week. The exercise should increase your heart rate and make you sweat (moderate-intensity exercise). ? Strengthening exercises at least twice a week.  Cut down on caffeine, tobacco, alcohol, and other potentially harmful substances.    Get the right amount and quality of sleep. Most adults need 7-9 hours of sleep each night.  Make choices that simplify your life.  Take over-the-counter and prescription medicines only as told by your health care provider.  Avoid caffeine, alcohol, and certain over-the-counter cold medicines. These may make you feel worse. Ask your pharmacist which medicines to avoid.  Keep all follow-up visits as told by your health care provider. This is important. Questions to ask your health care provider  Would I benefit from therapy?  How often should I follow up with a health care provider?  How long do I need to take medicine?  Are there any long-term side effects of my  medicine?  Are there any alternatives to taking medicine? Contact a health care provider if:  You have a hard time staying focused or finishing daily tasks.  You spend many hours a day feeling worried about everyday life.  You become exhausted by worry.  You start to have headaches, feel tense, or have nausea.  You urinate more than normal.  You have diarrhea. Get help right away if:  You have a racing heart and shortness of breath.  You have thoughts of hurting yourself or others. If you ever feel like you may hurt yourself or others, or have thoughts about taking your own life, get help right away. You can go to your nearest emergency department or call:  Your local emergency services (911 in the [place]  A suicide crisis helpline, such as the National Suicide Prevention Lifeline at 1-800-273-8255. This is open 24-hours a day.  Summary  Taking steps to deal with stress can help calm you.  Medicines cannot cure anxiety disorders, but they can help ease symptoms.  Family, friends, and partners can play a big part in helping you recover from an anxiety disorder. This information is not intended to replace advice given to you by your health care provider. Make sure you discuss any questions you have with your health care provider. Document Released: 10/06/2016 Document Revised: 10/06/2016 Document Reviewed: 10/06/2016 Elsevier Interactive Patient Education  2018 Elsevier Inc.   

## 2018-07-12 NOTE — Progress Notes (Signed)
Jose Hall, is a 32 y.o. male  [id number]  [id number]  DOB - 1986/10/18  Chief Complaint  Patient presents with  . Follow-up    anxiety       Subjective:   Jose Hall is a 32 y.o. male with history of anxiety and asthma who presents here today for a routine follow up visit. Patient claims he has been doing very well since the last visit in June, anxiety has significantly reduced, patient now breaths easily, very few panic episodes, adherence with his medications and no ED visits since June. He is level of anxiety has reduced significantly, he is now able to hold a job and goes to work daily. Today no symptoms related to his asthma. No wheezing. No chest tightness. No difficulty in breathing. He denies any headache. He denies any suicidal ideation or thoughts. Patient has No chest pain, No abdominal pain - No Nausea, No new weakness tingling or numbness, No Cough - SOB.  Problem  Elevated Blood Pressure Reading  Moderate Persistent Asthma Without Complication   Qualifier: Diagnosis of  By: Abundio MiuMcGregor, Barbara       ALLERGIES: No Known Allergies  PAST MEDICAL HISTORY: Past Medical History:  Diagnosis Date  . Anxiety   . Asthma     MEDICATIONS AT HOME: Prior to Admission medications   Medication Sig Start Date End Date Taking? Authorizing Provider  clonazePAM (KLONOPIN) 0.5 MG tablet Take 1 tablet (0.5 mg total) by mouth 2 (two) times daily as needed for anxiety. 06/03/18  Yes Quentin AngstJegede, Magie Ciampa E, MD  albuterol (PROVENTIL HFA;VENTOLIN HFA) 108 (90 Base) MCG/ACT inhaler Inhale 1-2 puffs into the lungs every 6 (six) hours as needed for wheezing or shortness of breath. Patient not taking: Reported on 07/12/2018 01/08/18   Palumbo, April, MD  beclomethasone (QVAR REDIHALER) 80 MCG/ACT inhaler Inhale 2 puffs into the lungs daily. Patient not taking: Reported on 04/02/2018 03/02/18   Storm FriskWright, Patrick E, MD  fluticasone [facility](FLONASE) 50 MCG/ACT nasal spray Place 2 sprays into both  nostrils daily. Patient not taking: Reported on 04/05/2018 12/21/17   Quentin AngstJegede, Sheyna Pettibone E, MD  hydrOXYzine (ATARAX/VISTARIL) 25 MG tablet Take 1 tablet (25 mg total) by mouth 3 (three) times daily as needed. Patient not taking: Reported on 07/12/2018 04/05/18   Quentin AngstJegede, Blease Capaldi E, MD  loratadine (CLARITIN) 10 MG tablet Take 1 tablet (10 mg total) by mouth daily. Patient not taking: Reported on 07/12/2018 02/19/18   Janne NapoleonNeese, Hope M, NP  metoprolol tartrate (LOPRESSOR) 25 MG tablet Take 1 tablet (25 mg total) by mouth 2 (two) times daily. Patient not taking: Reported on 03/02/2018 01/28/18   Rolan BuccoBelfi, Melanie, MD  Multiple Vitamin (MULTIVITAMIN WITH MINERALS) TABS tablet Take 1 tablet by mouth daily.    [provider]  Spacer/Aero-Holding Chambers (AEROCHAMBER MV) inhaler Use as instructed Patient not taking: Reported on 04/02/2018 03/02/18   Storm FriskWright, Patrick E, MD    Objective:   Vitals:   07/12/18 0950  BP: 123/80  Pulse: 93  Resp: 16  Temp: 98.5 F (36.9 C)  TempSrc: Oral  SpO2: 100%  Weight: 162 lb (73.5 kg)  Height: 5\' 7"  (1.702 m)   Exam General appearance : Awake, alert, not in any distress. Speech Clear. Not toxic looking HEENT: Atraumatic and Normocephalic, pupils equally reactive to light and accomodation Neck: Supple, no JVD. No cervical lymphadenopathy.  Chest: Good air entry bilaterally, no added sounds  CVS: S1 S2 regular, no murmurs.  Abdomen: Bowel sounds present, Non tender and not  distended with no gaurding, rigidity or rebound. Extremities: B/L Lower Ext shows no edema, both legs are warm to touch Neurology: Awake alert, and oriented X 3, CN II-XII intact, Non focal Skin: No Rash  Data Review No results found for: HGBA1C  Assessment & Plan   1. Generalized anxiety disorder  - Continue Klonopin, Refilled - Continue Hydroxyzine - Deep breathing exercise  2. Moderate persistent asthma without complication  - Continue Current medications   3. Elevated blood  pressure reading  Patient has no personal history of hypertension, Ambulatory blood pressure monitoring and blood pressure today is normal No indication for blood pressure medication today.  Patient have been counseled extensively about nutrition and exercise. Other issues discussed during this visit include: low cholesterol diet, weight control and daily exercise, foot care, annual eye examinations at Ophthalmology, importance of adherence with medications and regular follow-up. We also discussed long term complications of uncontrolled diabetes and hypertension.   Return in about 3 months (around 10/11/2018) for Generalized Anxiety Disorder, Routine Follow Up.  The patient was given clear instructions to go to ER or return to medical center if symptoms don't improve, worsen or new problems develop. The patient verbalized understanding. The patient was told to call to get lab results if they haven't heard anything in the next week.   This note has been created with Education officer, environmental. Any transcriptional errors are unintentional.    Jeanann Lewandowsky, MD, MHA, Maxwell Caul, CPE [facility] [place], [place] [phone]   07/12/2018, 10:23 AM

## 2018-08-02 ENCOUNTER — Telehealth: Payer: Self-pay

## 2018-08-04 ENCOUNTER — Other Ambulatory Visit: Payer: Self-pay | Admitting: Internal Medicine

## 2018-08-04 MED ORDER — CLONAZEPAM 0.5 MG PO TABS: 0.5000 mg | ORAL_TABLET | Freq: Two times a day (BID) | ORAL | 1 refills | Status: DC | PRN

## 2018-08-04 NOTE — Telephone Encounter (Signed)
Refilled

## 2018-08-05 IMAGING — CR DG CHEST 2V
2 series · 2 of 2 positions shown · non-contrast
Comparison: Chest CT 05/18/2004 and chest radiograph 05/18/2004

CLINICAL DATA: Shortness of breath and chest pain

EXAM:
CHEST  2 VIEW

[w chest pa]
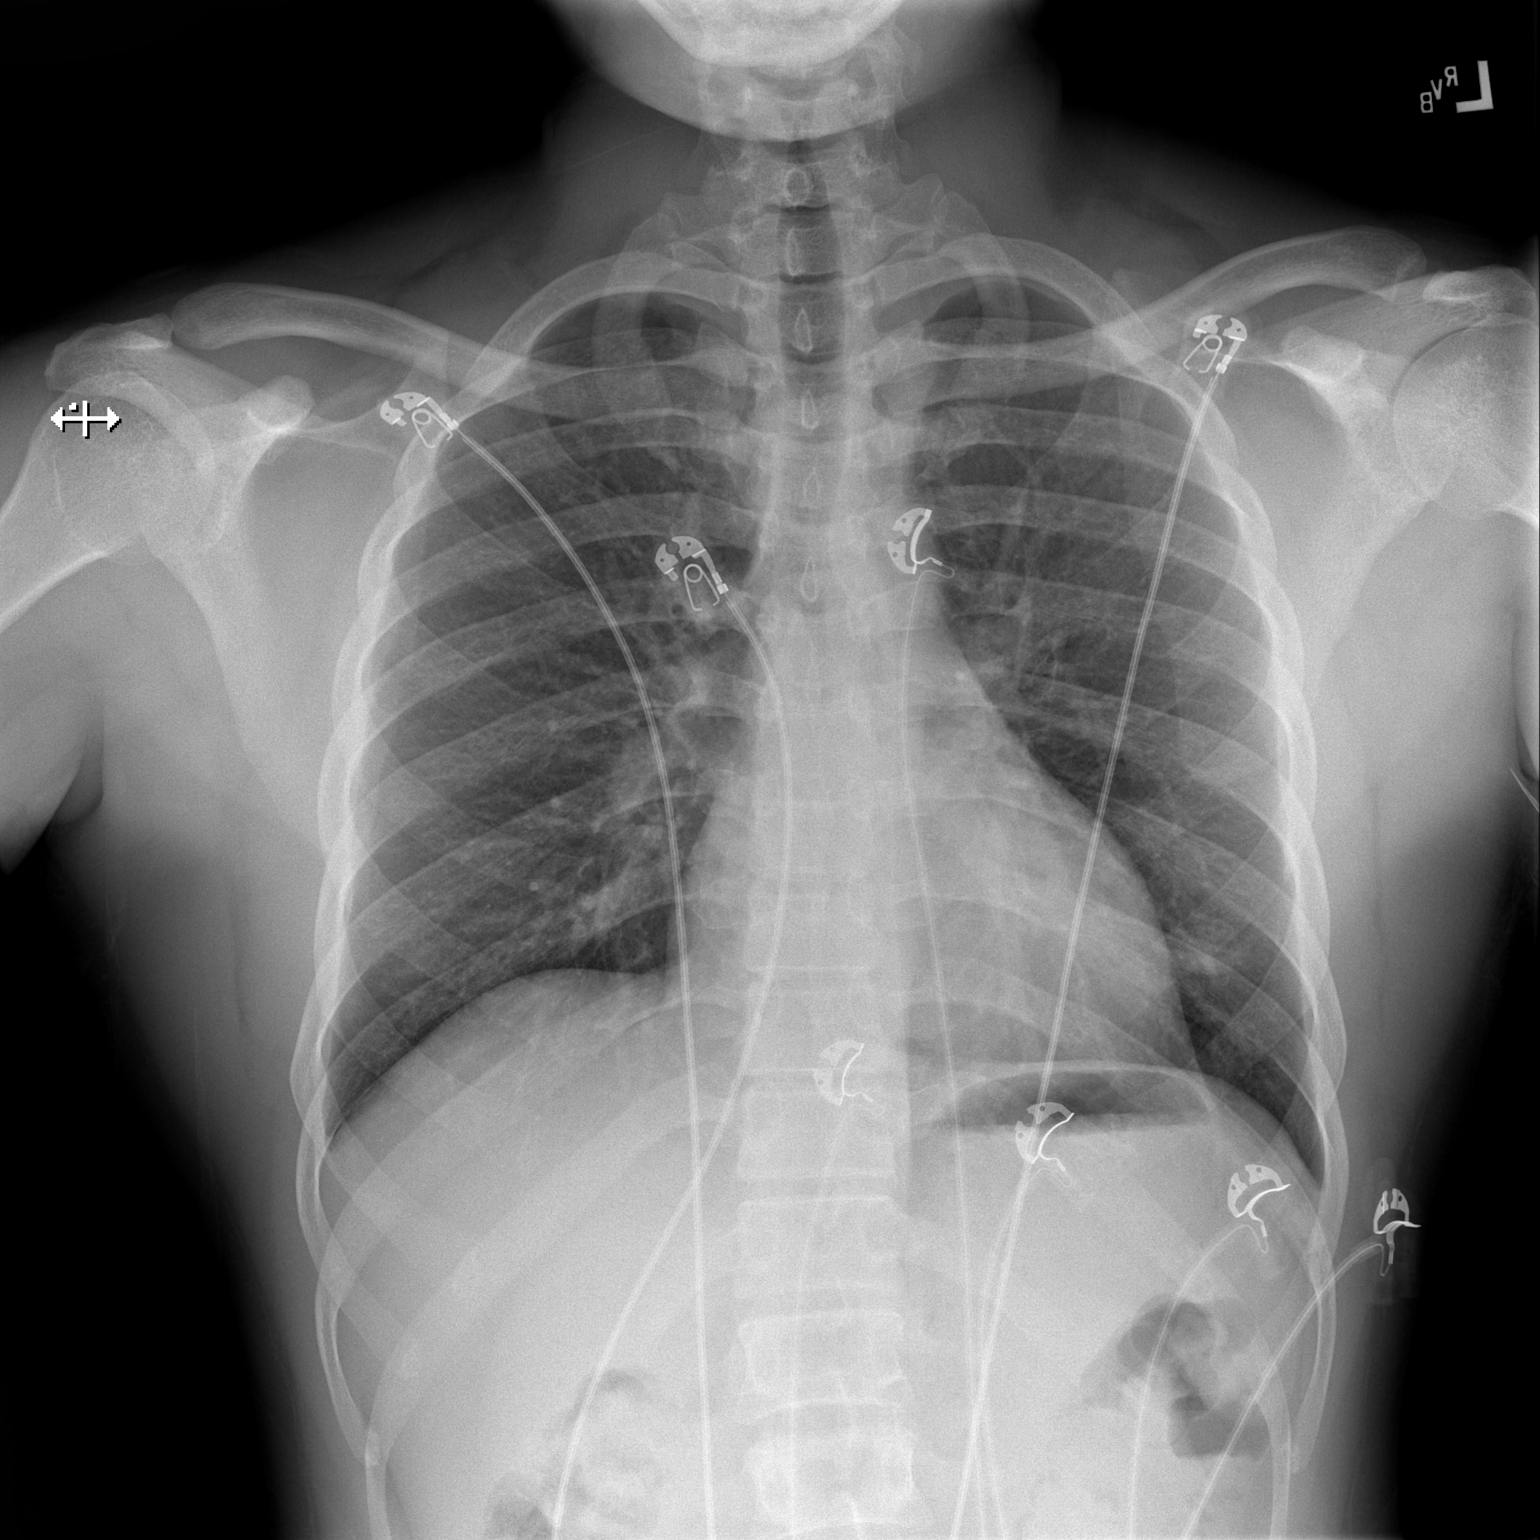

[w chest lat]
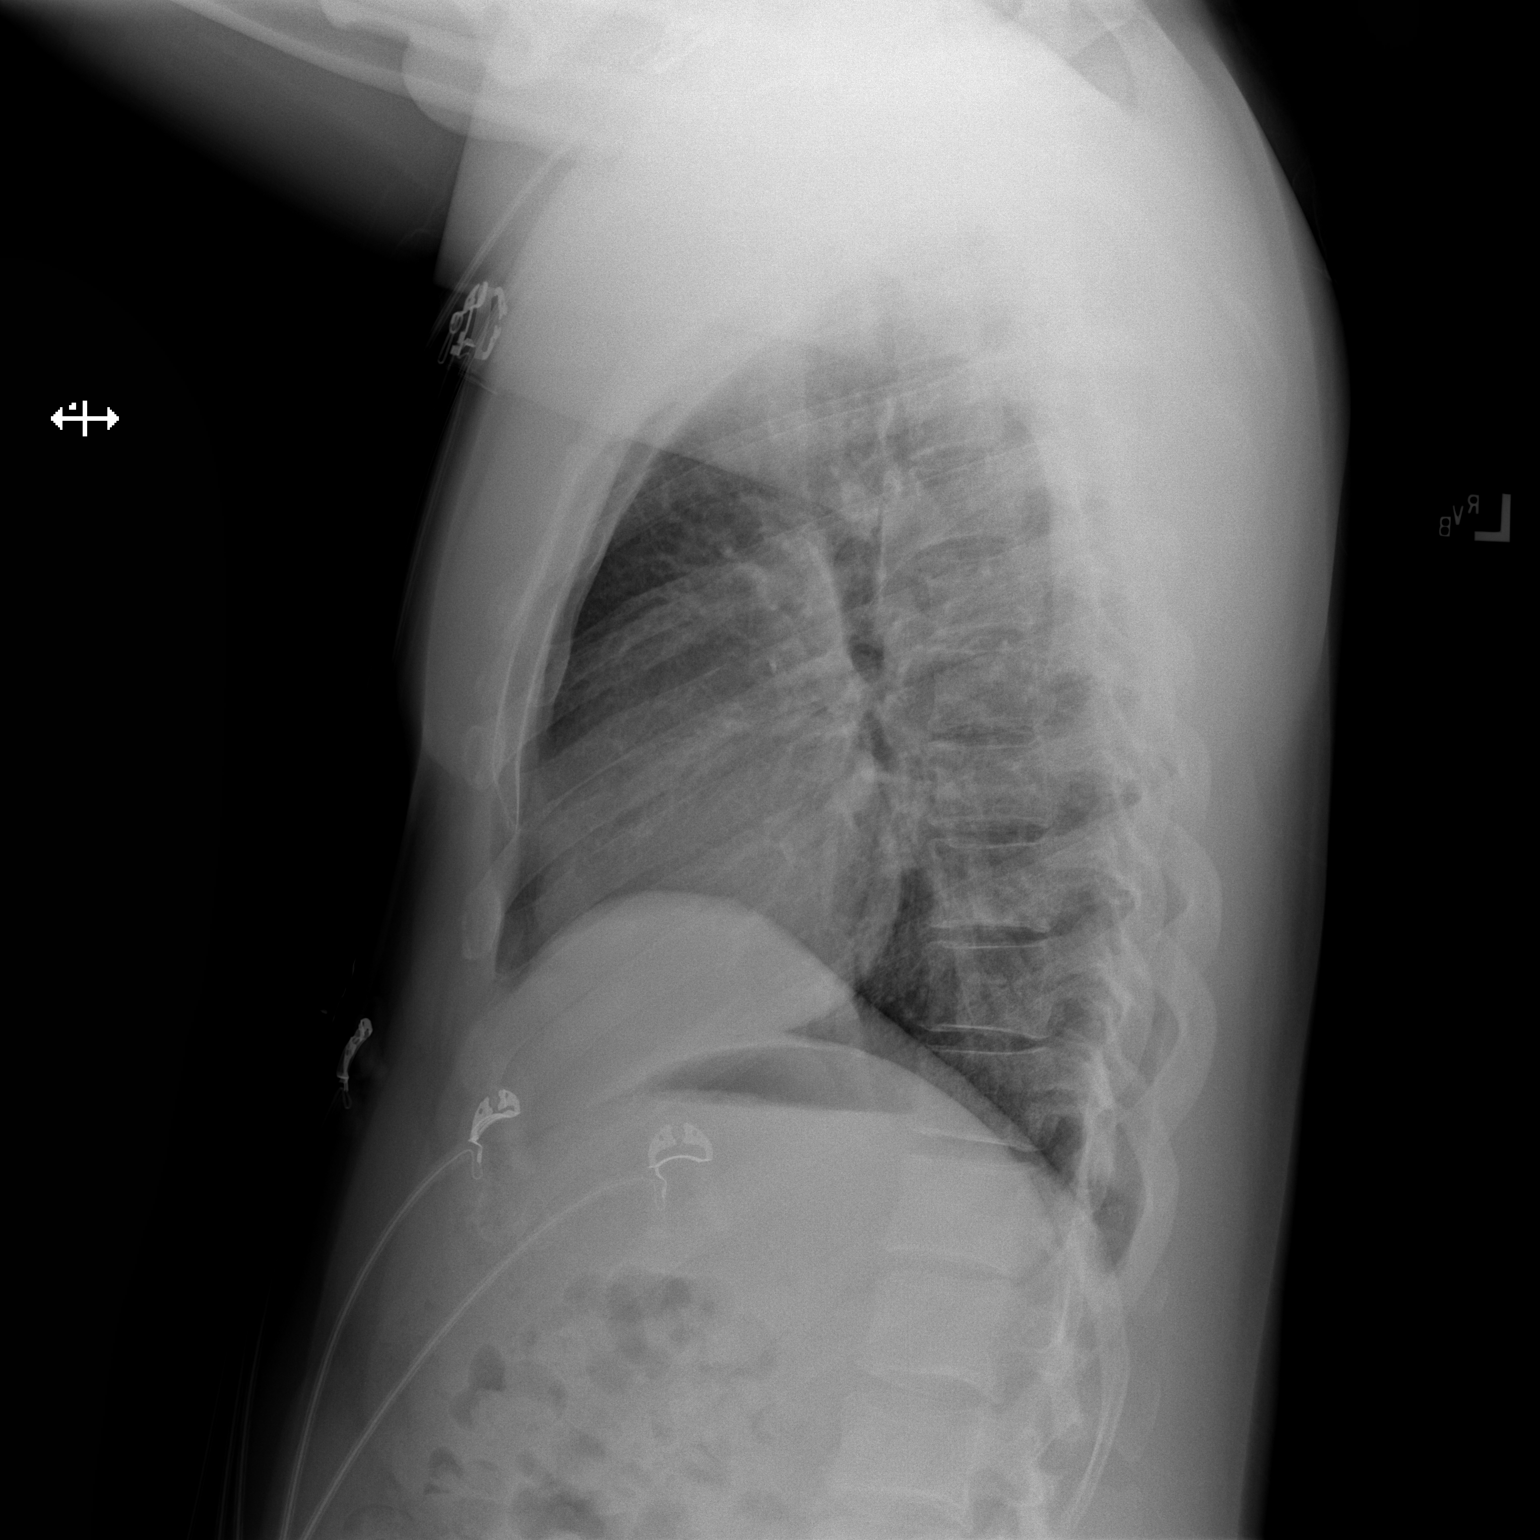

[2 of 2 positions shown; findings below may reference images not displayed]

FINDINGS: The heart size and mediastinal contours are within normal limits.
Both lungs are clear. The visualized skeletal structures are
unremarkable.
IMPRESSION: No active cardiopulmonary disease.

## 2018-09-06 ENCOUNTER — Ambulatory Visit (INDEPENDENT_AMBULATORY_CARE_PROVIDER_SITE_OTHER): Payer: Self-pay | Admitting: Internal Medicine

## 2018-09-06 VITALS — BP 122/90 | HR 77 | Temp 97.9°F | Resp 16 | Ht 67.0 in | Wt 164.0 lb

## 2018-09-06 DIAGNOSIS — J4540 Moderate persistent asthma, uncomplicated: Secondary | ICD-10-CM

## 2018-09-06 DIAGNOSIS — F411 Generalized anxiety disorder: Secondary | ICD-10-CM

## 2018-09-06 NOTE — Progress Notes (Signed)
Jose Hall, is a 32 y.o. male  ZOX:[id number]  [id number]:[id number]  DOB - 1985/10/31  Chief Complaint  Patient presents with  . Anxiety      Subjective:   Jose Hall is a 32 y.o. male with medical history significant for generalized anxiety and asthma who presents here today for routine follow-up visit of anxiety. He claims he is doing really well since starting Klonopin, he does not get to use Klonopin every day anymore. He has not been to the emergency room since June. His level of anxiety has reduced significantly to the extent that he goes to work daily without any episodes. He claims adherence to medications. Today he has no new complaints. He denies any wheezing. No chest tightness. No difficulty breathing.  He denies any suicidal ideation or thoughts. He currently lives with his fiance, has no domestic concern. Patient has No headache, No chest pain, No abdominal pain - No Nausea, No new weakness tingling or numbness, No Cough - SOB.  No problems updated.  ALLERGIES: No Known Allergies  PAST MEDICAL HISTORY: Past Medical History:  Diagnosis Date  . Anxiety   . Asthma     MEDICATIONS AT HOME: Prior to Admission medications   Medication Sig Start Date End Date Taking? Authorizing Provider  clonazePAM (KLONOPIN) 0.5 MG tablet Take 1 tablet (0.5 mg total) by mouth 2 (two) times daily as needed for anxiety. 08/04/18  Yes Quentin Angst, MD  albuterol (PROVENTIL HFA;VENTOLIN HFA) 108 (90 Base) MCG/ACT inhaler Inhale 1-2 puffs into the lungs every 6 (six) hours as needed for wheezing or shortness of breath. Patient not taking: Reported on 09/06/2018 01/08/18   Palumbo, April, MD  hydrOXYzine (ATARAX/VISTARIL) 25 MG tablet Take 1 tablet (25 mg total) by mouth 3 (three) times daily as needed. Patient not taking: Reported on 07/12/2018 04/05/18   Quentin Angst, MD  loratadine (CLARITIN) 10 MG tablet Take 1 tablet (10 mg total) by mouth daily. Patient not taking: Reported  on 07/12/2018 02/19/18   Janne Napoleon, NP  Multiple Vitamin (MULTIVITAMIN WITH MINERALS) TABS tablet Take 1 tablet by mouth daily.    [provider]  Spacer/Aero-Holding Chambers (AEROCHAMBER MV) inhaler Use as instructed Patient not taking: Reported on 04/02/2018 03/02/18   Storm Frisk, MD    Objective:   Vitals:   09/06/18 1047  BP: 122/90  Pulse: 77  Resp: 16  Temp: 97.9 F (36.6 C)  TempSrc: Oral  SpO2: 100%  Weight: 164 lb (74.4 kg)  Height: 5\' 7"  (1.702 m)   Exam General appearance : Awake, alert, not in any distress. Speech Clear. Not toxic looking HEENT: Atraumatic and Normocephalic, pupils equally reactive to light and accomodation Neck: Supple, no JVD. No cervical lymphadenopathy.  Chest: Good air entry bilaterally, no added sounds  CVS: S1 S2 regular, no murmurs.  Abdomen: Bowel sounds present, Non tender and not distended with no gaurding, rigidity or rebound. Extremities: B/L Lower Ext shows no edema, both legs are warm to touch Neurology: Awake alert, and oriented X 3, CN II-XII intact, Non focal Skin: No Rash  Data Review No results found for: HGBA1C  Assessment & Plan   1. Generalized anxiety disorder  - Continue Klonopin - Continue hydroxyzine - Deep breathing exercise  2. Moderate persistent asthma without complication  - Continue current medications - Patient counseled extensively   Patient have been counseled extensively about nutrition and exercise. Other issues discussed during this visit include: low cholesterol diet, weight control and  daily exercise, importance of adherence with medications and regular follow-up.   Return in about 3 months (around 12/07/2018) for Depression and Anxiety.  The patient was given clear instructions to go to ER or return to medical center if symptoms don't improve, worsen or new problems develop. The patient verbalized understanding. The patient was told to call to get lab results if they haven't heard  anything in the next week.   This note has been created with Education officer, environmental. Any transcriptional errors are unintentional.    Jeanann Lewandowsky, MD, MHA, Maxwell Caul, CPE [facility] [place], [place] [phone]   09/06/2018, 11:11 AM

## 2018-09-06 NOTE — Patient Instructions (Signed)

## 2018-10-04 ENCOUNTER — Telehealth: Payer: Self-pay

## 2018-10-06 ENCOUNTER — Other Ambulatory Visit: Payer: Self-pay | Admitting: Internal Medicine

## 2018-10-06 MED ORDER — CLONAZEPAM 0.5 MG PO TABS: 0.5000 mg | ORAL_TABLET | Freq: Two times a day (BID) | ORAL | 1 refills | Status: DC | PRN

## 2018-10-06 NOTE — Telephone Encounter (Signed)
Refilled

## 2018-11-02 ENCOUNTER — Telehealth: Payer: Self-pay

## 2018-11-04 ENCOUNTER — Other Ambulatory Visit: Payer: Self-pay | Admitting: Internal Medicine

## 2018-11-04 ENCOUNTER — Telehealth: Payer: Self-pay

## 2018-11-04 MED ORDER — CLONAZEPAM 0.5 MG PO TABS: 0.5000 mg | ORAL_TABLET | Freq: Two times a day (BID) | ORAL | 0 refills | Status: DC | PRN

## 2018-11-04 NOTE — Telephone Encounter (Signed)
Refilled

## 2018-11-10 IMAGING — CR DG CHEST 2V
2 series · 2 of 2 positions shown · non-contrast
Comparison: 09/01/2017 and prior chest radiographs

CLINICAL DATA: Acute shortness of breath.

EXAM:
CHEST  2 VIEW

[w chest pa]
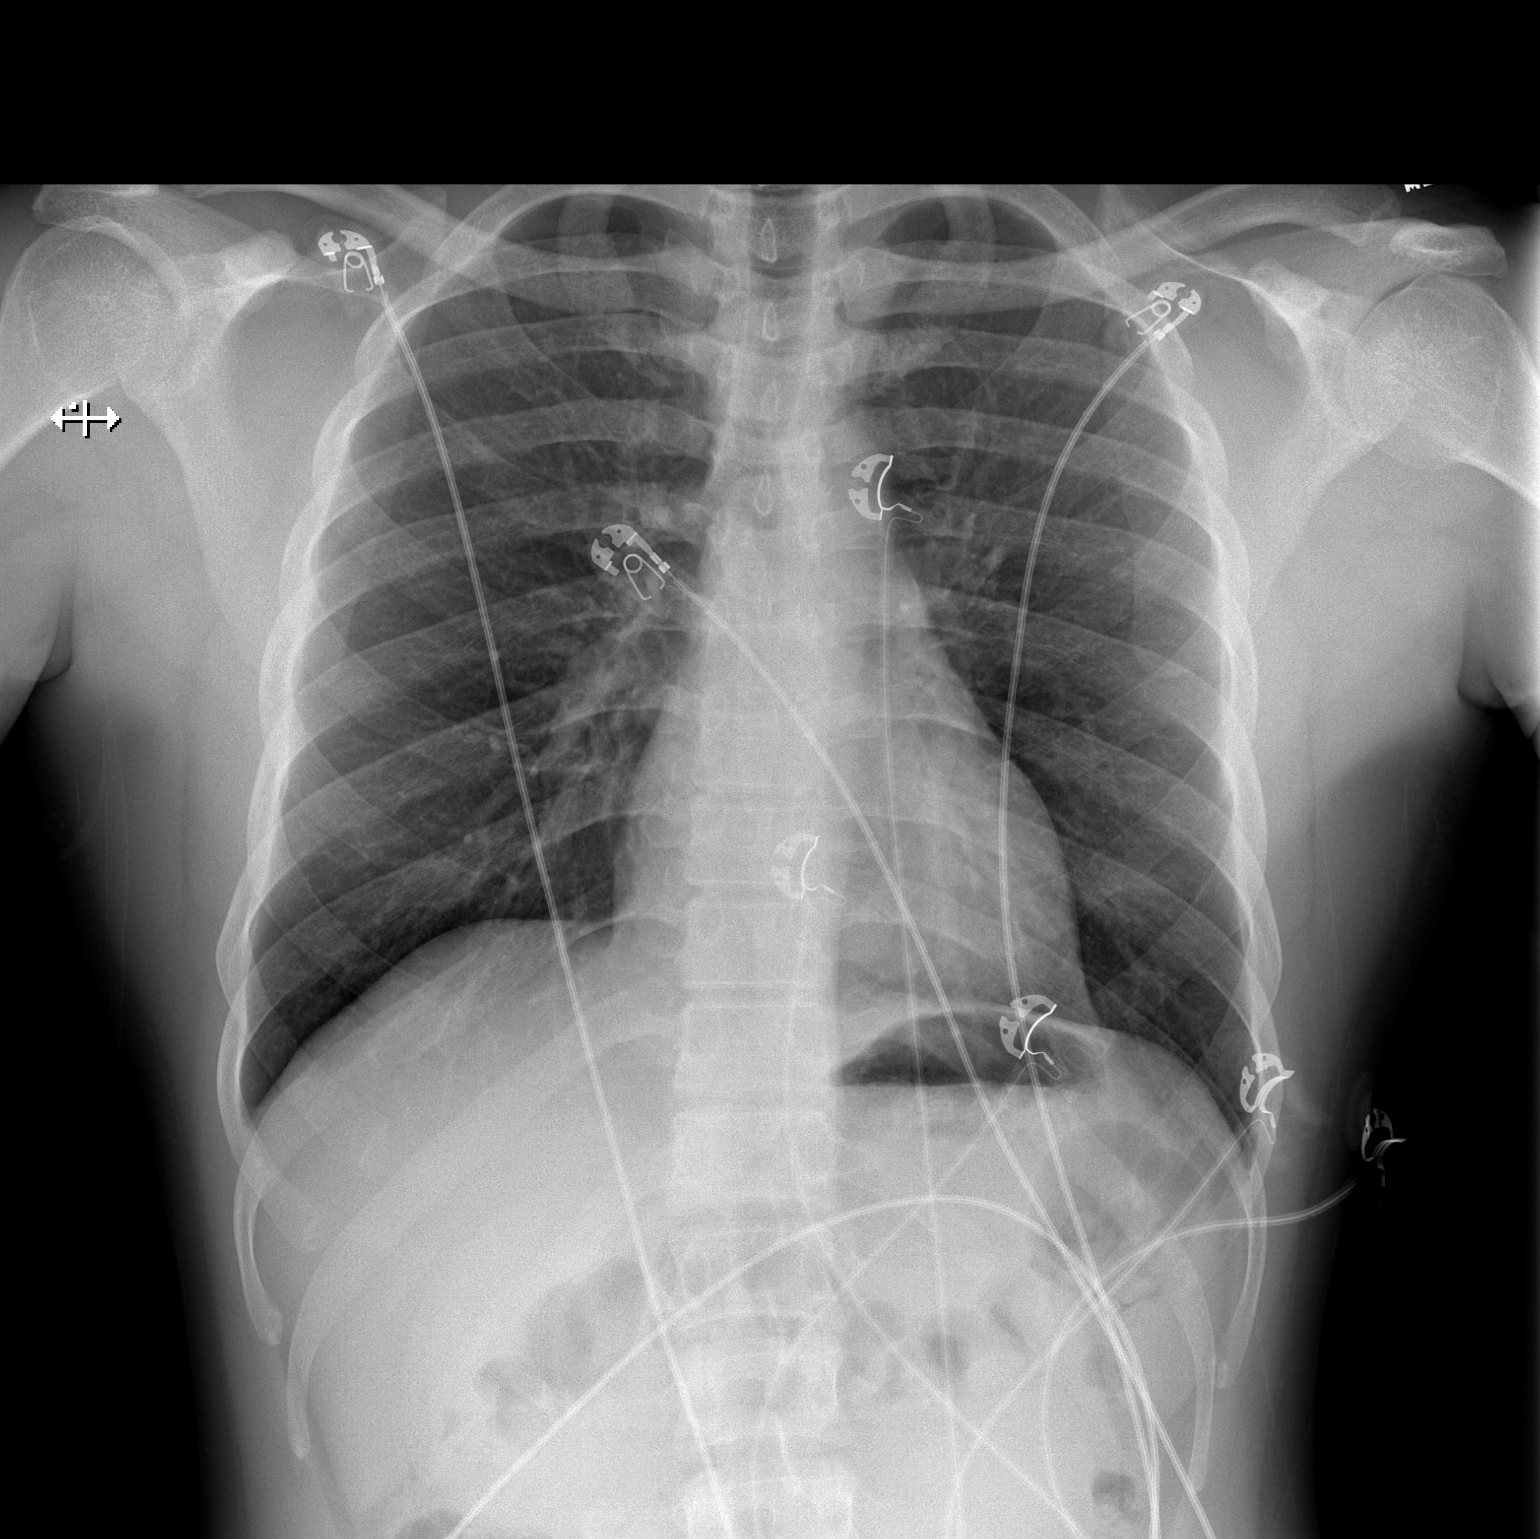

[w chest lat]
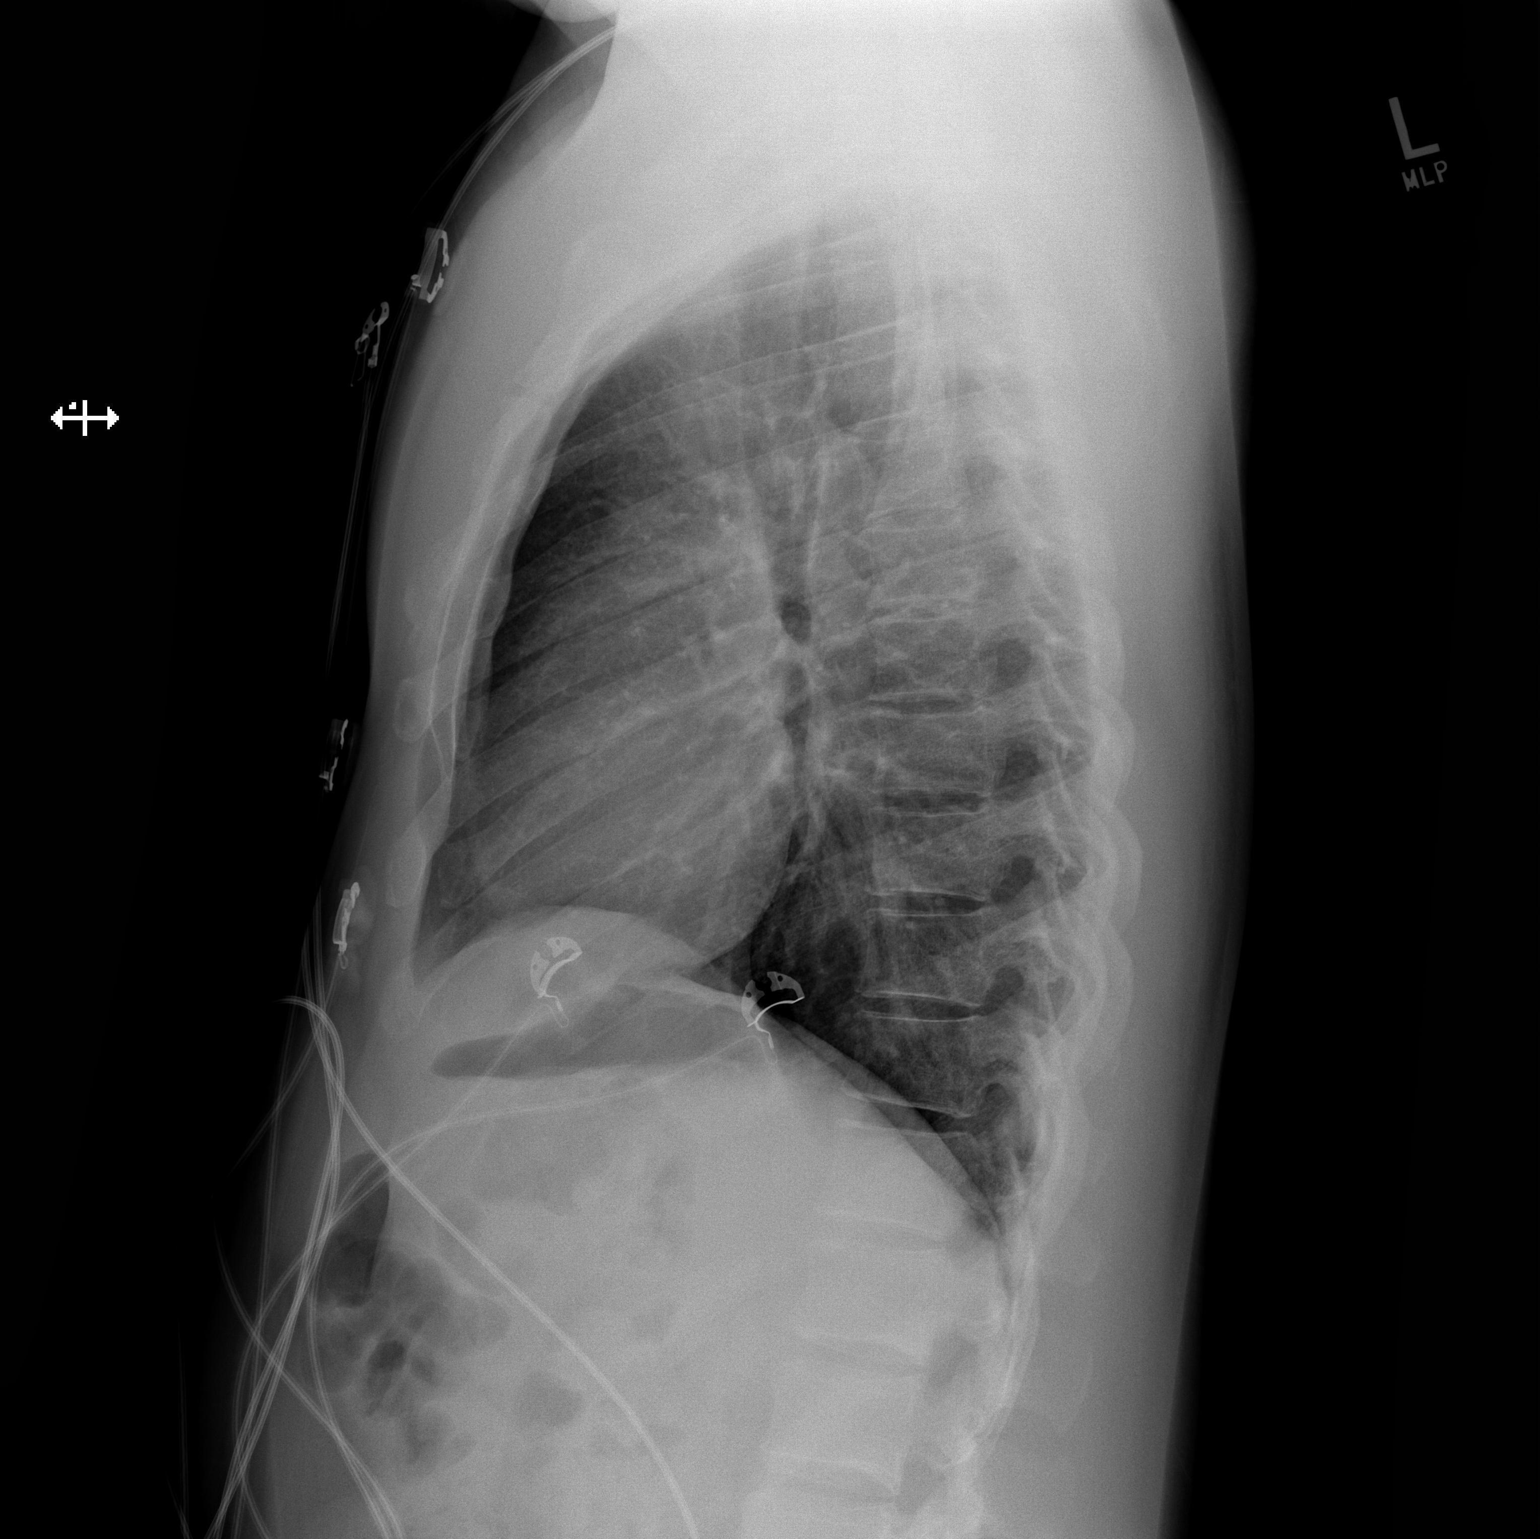

[2 of 2 positions shown; findings below may reference images not displayed]

FINDINGS: The cardiomediastinal silhouette is unremarkable.

There is no evidence of focal airspace disease, pulmonary edema,
suspicious pulmonary nodule/mass, pleural effusion, or pneumothorax.
No acute bony abnormalities are identified.
IMPRESSION: No active cardiopulmonary disease.

## 2018-11-15 ENCOUNTER — Ambulatory Visit (INDEPENDENT_AMBULATORY_CARE_PROVIDER_SITE_OTHER): Payer: Self-pay | Admitting: Internal Medicine

## 2018-11-15 ENCOUNTER — Encounter: Payer: Self-pay | Admitting: Internal Medicine

## 2018-11-15 VITALS — BP 132/82 | HR 78 | Temp 98.6°F | Resp 18 | Ht 67.0 in | Wt 164.0 lb

## 2018-11-15 DIAGNOSIS — F411 Generalized anxiety disorder: Secondary | ICD-10-CM

## 2018-11-15 DIAGNOSIS — J301 Allergic rhinitis due to pollen: Secondary | ICD-10-CM

## 2018-11-15 NOTE — Progress Notes (Signed)
   Jose Hall, is a 33 y.o. male  QBV:[id number]  KCM:[id number]  DOB - December 24, 1985  Chief Complaint  Patient presents with  . Anxiety       Subjective:   Jose Hall is a 33 y.o. male with medical history significant for generalized anxiety disorder and asthma who presents here today for a routine follow up visit. Recently broke up with his girlfriend because of pressure to get married soon, he claims he is just not ready. He has been a little more anxious since the break-up but bearable with medication. He denies any suicidal ideation or thought. His last ED visit was June 2019. Today he has no other complaint. He claims adherence with his medications. He denies any wheezing.  No chest tightness.  No difficulty breathing. Patient has No headache, No chest pain, No abdominal pain - No Nausea, No new weakness tingling or numbness, No Cough - SOB.  Problem  Seasonal Allergic Rhinitis Due to Pollen    ALLERGIES: No Known Allergies  PAST MEDICAL HISTORY: Past Medical History:  Diagnosis Date  . Anxiety   . Asthma     MEDICATIONS AT HOME: Prior to Admission medications   Medication Sig Start Date End Date Taking? Authorizing Provider  clonazePAM (KLONOPIN) 0.5 MG tablet Take 1 tablet (0.5 mg total) by mouth 2 (two) times daily as needed for anxiety. 11/04/18  Yes Quentin Angst, MD    Objective:   Vitals:   11/15/18 1007  BP: 132/82  Pulse: 78  Resp: 18  Temp: 98.6 F (37 C)  TempSrc: Oral  SpO2: 100%  Weight: 164 lb (74.4 kg)  Height: 5\' 7"  (1.702 m)   Exam General appearance : Awake, alert, not in any distress. Speech Clear. Not toxic looking HEENT: Atraumatic and Normocephalic, pupils equally reactive to light and accomodation Neck: Supple, no JVD. No cervical lymphadenopathy.  Chest: Good air entry bilaterally, no added sounds  CVS: S1 S2 regular, no murmurs.  Abdomen: Bowel sounds present, Non tender and not distended with no gaurding, rigidity or  rebound. Extremities: B/L Lower Ext shows no edema, both legs are warm to touch Neurology: Awake alert, and oriented X 3, CN II-XII intact, Non focal Skin: No Rash  Data Review No results found for: HGBA1C  Assessment & Plan   1. Generalized anxiety disorder  - Continue Klonopin - Patient counseled extensively  2. Seasonal allergic rhinitis due to pollen  Continue Flonase  Avoid allergens and triggering factors  Patient have been counseled extensively about nutrition and exercise  Return in about 6 months (around 05/16/2019) for Generalized Anxiety Disorder.  The patient was given clear instructions to go to ER or return to medical center if symptoms don't improve, worsen or new problems develop. The patient verbalized understanding. The patient was told to call to get lab results if they haven't heard anything in the next week.   This note has been created with Education officer, environmental. Any transcriptional errors are unintentional.    Jeanann Lewandowsky, MD, MHA, Maxwell Caul, CPE [facility] and [facility] [place], [place] [phone]   11/15/2018, 10:40 AM

## 2018-11-15 NOTE — Patient Instructions (Signed)
Generalized Anxiety Disorder, Adult Generalized anxiety disorder (GAD) is a mental health disorder. People with this condition constantly worry about everyday events. Unlike normal anxiety, worry related to GAD is not triggered by a specific event. These worries also do not fade or get better with time. GAD interferes with life functions, including relationships, work, and school. GAD can vary from mild to severe. People with severe GAD can have intense waves of anxiety with physical symptoms (panic attacks). What are the causes? The exact cause of GAD is not known. What increases the risk? This condition is more likely to develop in:  Women.  People who have a family history of anxiety disorders.  People who are very shy.  People who experience very stressful life events, such as the death of a loved one.  People who have a very stressful family environment. What are the signs or symptoms? People with GAD often worry excessively about many things in their lives, such as their health and family. They may also be overly concerned about:  Doing well at work.  Being on time.  Natural disasters.  Friendships. Physical symptoms of GAD include:  Fatigue.  Muscle tension or having muscle twitches.  Trembling or feeling shaky.  Being easily startled.  Feeling like your heart is pounding or racing.  Feeling out of breath or like you cannot take a deep breath.  Having trouble falling asleep or staying asleep.  Sweating.  Nausea, diarrhea, or irritable bowel syndrome (IBS).  Headaches.  Trouble concentrating or remembering facts.  Restlessness.  Irritability. How is this diagnosed? Your health care provider can diagnose GAD based on your symptoms and medical history. You will also have a physical exam. The health care provider will ask specific questions about your symptoms, including how severe they are, when they started, and if they come and go. Your health care  provider may ask you about your use of alcohol or drugs, including prescription medicines. Your health care provider may refer you to a mental health specialist for further evaluation. Your health care provider will do a thorough examination and may perform additional tests to rule out other possible causes of your symptoms. To be diagnosed with GAD, a person must have anxiety that:  Is out of his or her control.  Affects several different aspects of his or her life, such as work and relationships.  Causes distress that makes him or her unable to take part in normal activities.  Includes at least three physical symptoms of GAD, such as restlessness, fatigue, trouble concentrating, irritability, muscle tension, or sleep problems. Before your health care provider can confirm a diagnosis of GAD, these symptoms must be present more days than they are not, and they must last for six months or longer. How is this treated? The following therapies are usually used to treat GAD:  Medicine. Antidepressant medicine is usually prescribed for long-term daily control. Antianxiety medicines may be added in severe cases, especially when panic attacks occur.  Talk therapy (psychotherapy). Certain types of talk therapy can be helpful in treating GAD by providing support, education, and guidance. Options include: ? Cognitive behavioral therapy (CBT). People learn coping skills and techniques to ease their anxiety. They learn to identify unrealistic or negative thoughts and behaviors and to replace them with positive ones. ? Acceptance and commitment therapy (ACT). This treatment teaches people how to be mindful as a way to cope with unwanted thoughts and feelings. ? Biofeedback. This process trains you to manage your body's response (  physiological response) through breathing techniques and relaxation methods. You will work with a therapist while machines are used to monitor your physical symptoms.  Stress  management techniques. These include yoga, meditation, and exercise. A mental health specialist can help determine which treatment is best for you. Some people see improvement with one type of therapy. However, other people require a combination of therapies. Follow these instructions at home:  Take over-the-counter and prescription medicines only as told by your health care provider.  Try to maintain a normal routine.  Try to anticipate stressful situations and allow extra time to manage them.  Practice any stress management or self-calming techniques as taught by your health care provider.  Do not punish yourself for setbacks or for not making progress.  Try to recognize your accomplishments, even if they are small.  Keep all follow-up visits as told by your health care provider. This is important. Contact a health care provider if:  Your symptoms do not get better.  Your symptoms get worse.  You have signs of depression, such as: ? A persistently sad, cranky, or irritable mood. ? Loss of enjoyment in activities that used to bring you joy. ? Change in weight or eating. ? Changes in sleeping habits. ? Avoiding friends or family members. ? Loss of energy for normal tasks. ? Feelings of guilt or worthlessness. Get help right away if:  You have serious thoughts about hurting yourself or others. If you ever feel like you may hurt yourself or others, or have thoughts about taking your own life, get help right away. You can go to your nearest emergency department or call:  Your local emergency services (911 in the [place]  A suicide crisis helpline, such as the National Suicide Prevention Lifeline at (530) 400-82681-585-112-9398. This is open 24 hours a day. Summary  Generalized anxiety disorder (GAD) is a mental health disorder that involves worry that is not triggered by a specific event.  People with GAD often worry excessively about many things in their lives, such as their health and  family.  GAD may cause physical symptoms such as restlessness, trouble concentrating, sleep problems, frequent sweating, nausea, diarrhea, headaches, and trembling or muscle twitching.  A mental health specialist can help determine which treatment is best for you. Some people see improvement with one type of therapy. However, other people require a combination of therapies. This information is not intended to replace advice given to you by your health care provider. Make sure you discuss any questions you have with your health care provider. Document Released: 02/06/2013 Document Revised: 09/01/2016 Document Reviewed: 09/01/2016 Elsevier Interactive Patient Education  2019 [employer] Allergic Rhinitis, Adult Allergic rhinitis is a reaction to allergens in the air. Allergens are tiny specks (particles) in the air that cause your body to have an allergic reaction. This condition cannot be passed from person to person (is not contagious). Allergic rhinitis cannot be cured, but it can be controlled. There are two types of allergic rhinitis:  Seasonal. This type is also called hay fever. It happens only during certain times of the year.  Perennial. This type can happen at any time of the year. What are the causes? This condition may be caused by:  Pollen from grasses, trees, and weeds.  House dust mites.  Pet dander.  Mold. What are the signs or symptoms? Symptoms of this condition include:  Sneezing.  Runny or stuffy nose (nasal congestion).  A lot of mucus in the back of the throat (postnasal drip).  Itchy nose.  Tearing of the eyes.  Trouble sleeping.  Being sleepy during day. How is this treated? There is no cure for this condition. You should avoid things that trigger your symptoms (allergens). Treatment can help to relieve symptoms. This may include:  Medicines that block allergy symptoms, such as antihistamines. These may be given as a shot, nasal spray, or  pill.  Shots that are given until your body becomes less sensitive to the allergen (desensitization).  Stronger medicines, if all other treatments have not worked. Follow these instructions at home: Avoiding allergens   Find out what you are allergic to. Common allergens include smoke, dust, and pollen.  Avoid them if you can. These are some of the things that you can do to avoid allergens: ? Replace carpet with wood, tile, or vinyl flooring. Carpet can trap dander and dust. ? Clean any mold found in the home. ? Do not smoke. Do not allow smoking in your home. ? Change your heating and air conditioning filter at least once a month. ? During allergy season:  Keep windows closed as much as you can. If possible, use air conditioning when there is a lot of pollen in the air.  Use a special filter for allergies with your furnace and air conditioner.  Plan outdoor activities when pollen counts are lowest. This is usually during the early morning or evening hours.  If you do go outdoors when pollen count is high, wear a special mask for people with allergies.  When you come indoors, take a shower and change your clothes before sitting on furniture or bedding. General instructions  Do not use fans in your home.  Do not hang clothes outside to dry.  Wear sunglasses to keep pollen out of your eyes.  Wash your hands right away after you touch household pets.  Take over-the-counter and prescription medicines only as told by your doctor.  Keep all follow-up visits as told by your doctor. This is important. Contact a doctor if:  You have a fever.  You have a cough that does not go away (is persistent).  You start to make whistling sounds when you breathe (wheeze).  Your symptoms do not get better with treatment.  You have thick fluid coming from your nose.  You start to have nosebleeds. Get help right away if:  Your tongue or your lips are swollen.  You have trouble  breathing.  You feel dizzy or you feel like you are going to pass out (faint).  You have cold sweats. Summary  Allergic rhinitis is a reaction to allergens in the air.  This condition may be caused by allergens. These include pollen, dust mites, pet dander, and mold.  Symptoms include a runny, itchy nose, sneezing, or tearing eyes. You may also have trouble sleeping or feel sleepy during the day.  Treatment includes taking medicines and avoiding allergens. You may also get shots or take stronger medicines.  Get help if you have a fever or a cough that does not stop. Get help right away if you are short of breath. This information is not intended to replace advice given to you by your health care provider. Make sure you discuss any questions you have with your health care provider. Document Released: 02/11/2011 Document Revised: 05/03/2018 Document Reviewed: 05/03/2018 Elsevier Interactive Patient Education  2019 [employer].

## 2018-12-06 ENCOUNTER — Telehealth: Payer: Self-pay

## 2018-12-06 ENCOUNTER — Other Ambulatory Visit: Payer: Self-pay | Admitting: Internal Medicine

## 2018-12-06 MED ORDER — CLONAZEPAM 0.5 MG PO TABS: 0.5000 mg | ORAL_TABLET | Freq: Two times a day (BID) | ORAL | 0 refills | Status: DC | PRN

## 2018-12-06 NOTE — Telephone Encounter (Signed)
Refilled

## 2019-01-06 ENCOUNTER — Telehealth: Payer: Self-pay

## 2019-01-08 ENCOUNTER — Telehealth: Payer: Self-pay

## 2019-01-08 ENCOUNTER — Other Ambulatory Visit: Payer: Self-pay | Admitting: Internal Medicine

## 2019-01-08 MED ORDER — CLONAZEPAM 0.5 MG PO TABS: 0.5000 mg | ORAL_TABLET | Freq: Two times a day (BID) | ORAL | 0 refills | Status: DC | PRN

## 2019-01-08 NOTE — Telephone Encounter (Signed)
Refilled

## 2019-02-07 ENCOUNTER — Telehealth: Payer: Self-pay

## 2019-02-07 ENCOUNTER — Other Ambulatory Visit: Payer: Self-pay | Admitting: Internal Medicine

## 2019-02-07 MED ORDER — CLONAZEPAM 0.5 MG PO TABS: 0.5000 mg | ORAL_TABLET | Freq: Two times a day (BID) | ORAL | 0 refills | Status: DC | PRN

## 2019-02-07 NOTE — Telephone Encounter (Signed)
Refilled

## 2019-02-21 NOTE — Telephone Encounter (Signed)
Message sent to provider 

## 2019-03-02 NOTE — Telephone Encounter (Signed)
Message sent to provider 

## 2019-03-08 ENCOUNTER — Telehealth: Payer: Self-pay

## 2019-03-08 ENCOUNTER — Other Ambulatory Visit: Payer: Self-pay | Admitting: Internal Medicine

## 2019-03-08 MED ORDER — CLONAZEPAM 0.5 MG PO TABS: 0.5000 mg | ORAL_TABLET | Freq: Two times a day (BID) | ORAL | 0 refills | Status: DC | PRN

## 2019-03-08 NOTE — Telephone Encounter (Signed)
Refilled

## 2019-03-14 IMAGING — CR DG CHEST 2V
2 series · 2 of 2 positions shown · non-contrast
Comparison: Chest radiograph January 07, 2018

CLINICAL DATA: Tachycardia.  History of asthma.

EXAM:
CHEST - 2 VIEW

[w chest pa]
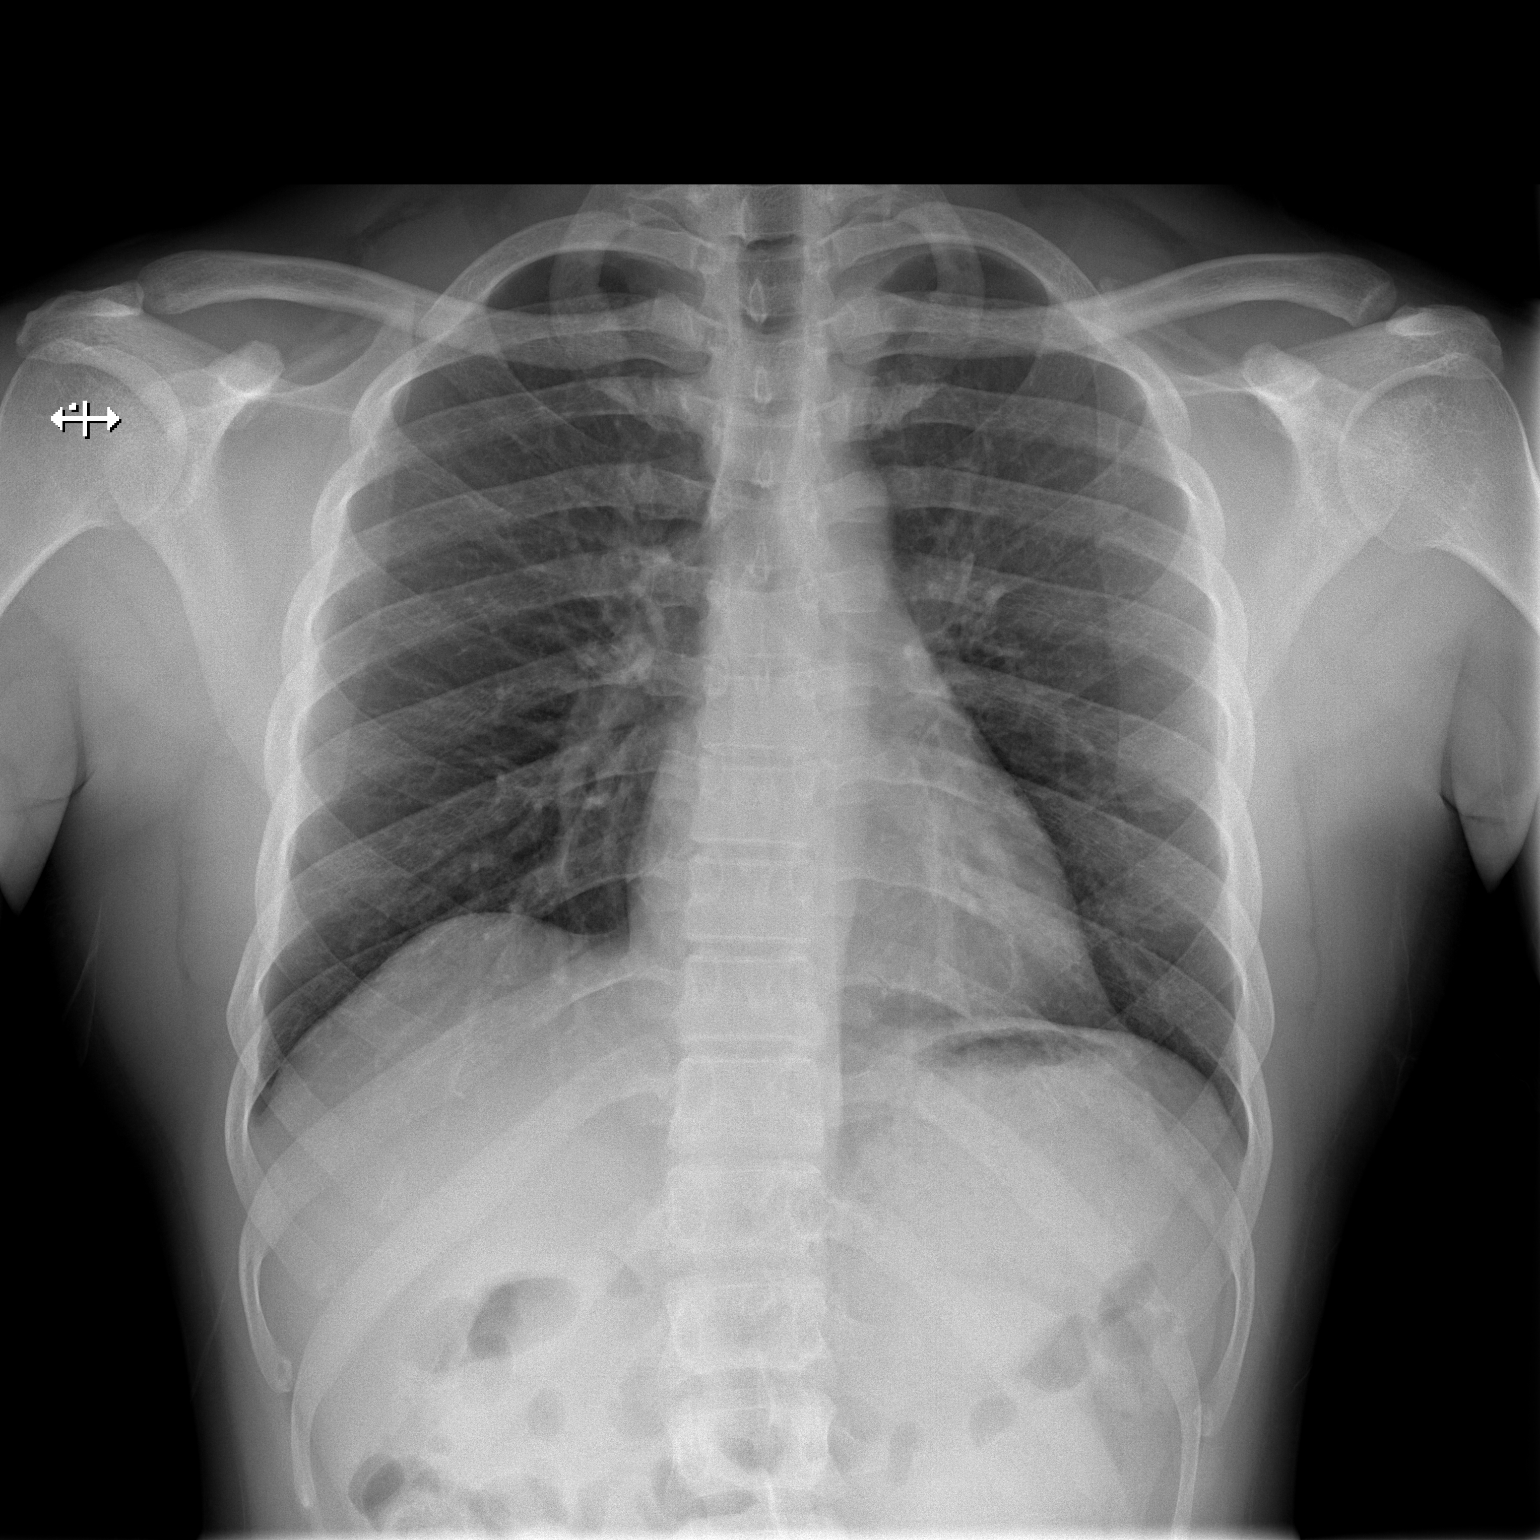

[w chest lat]
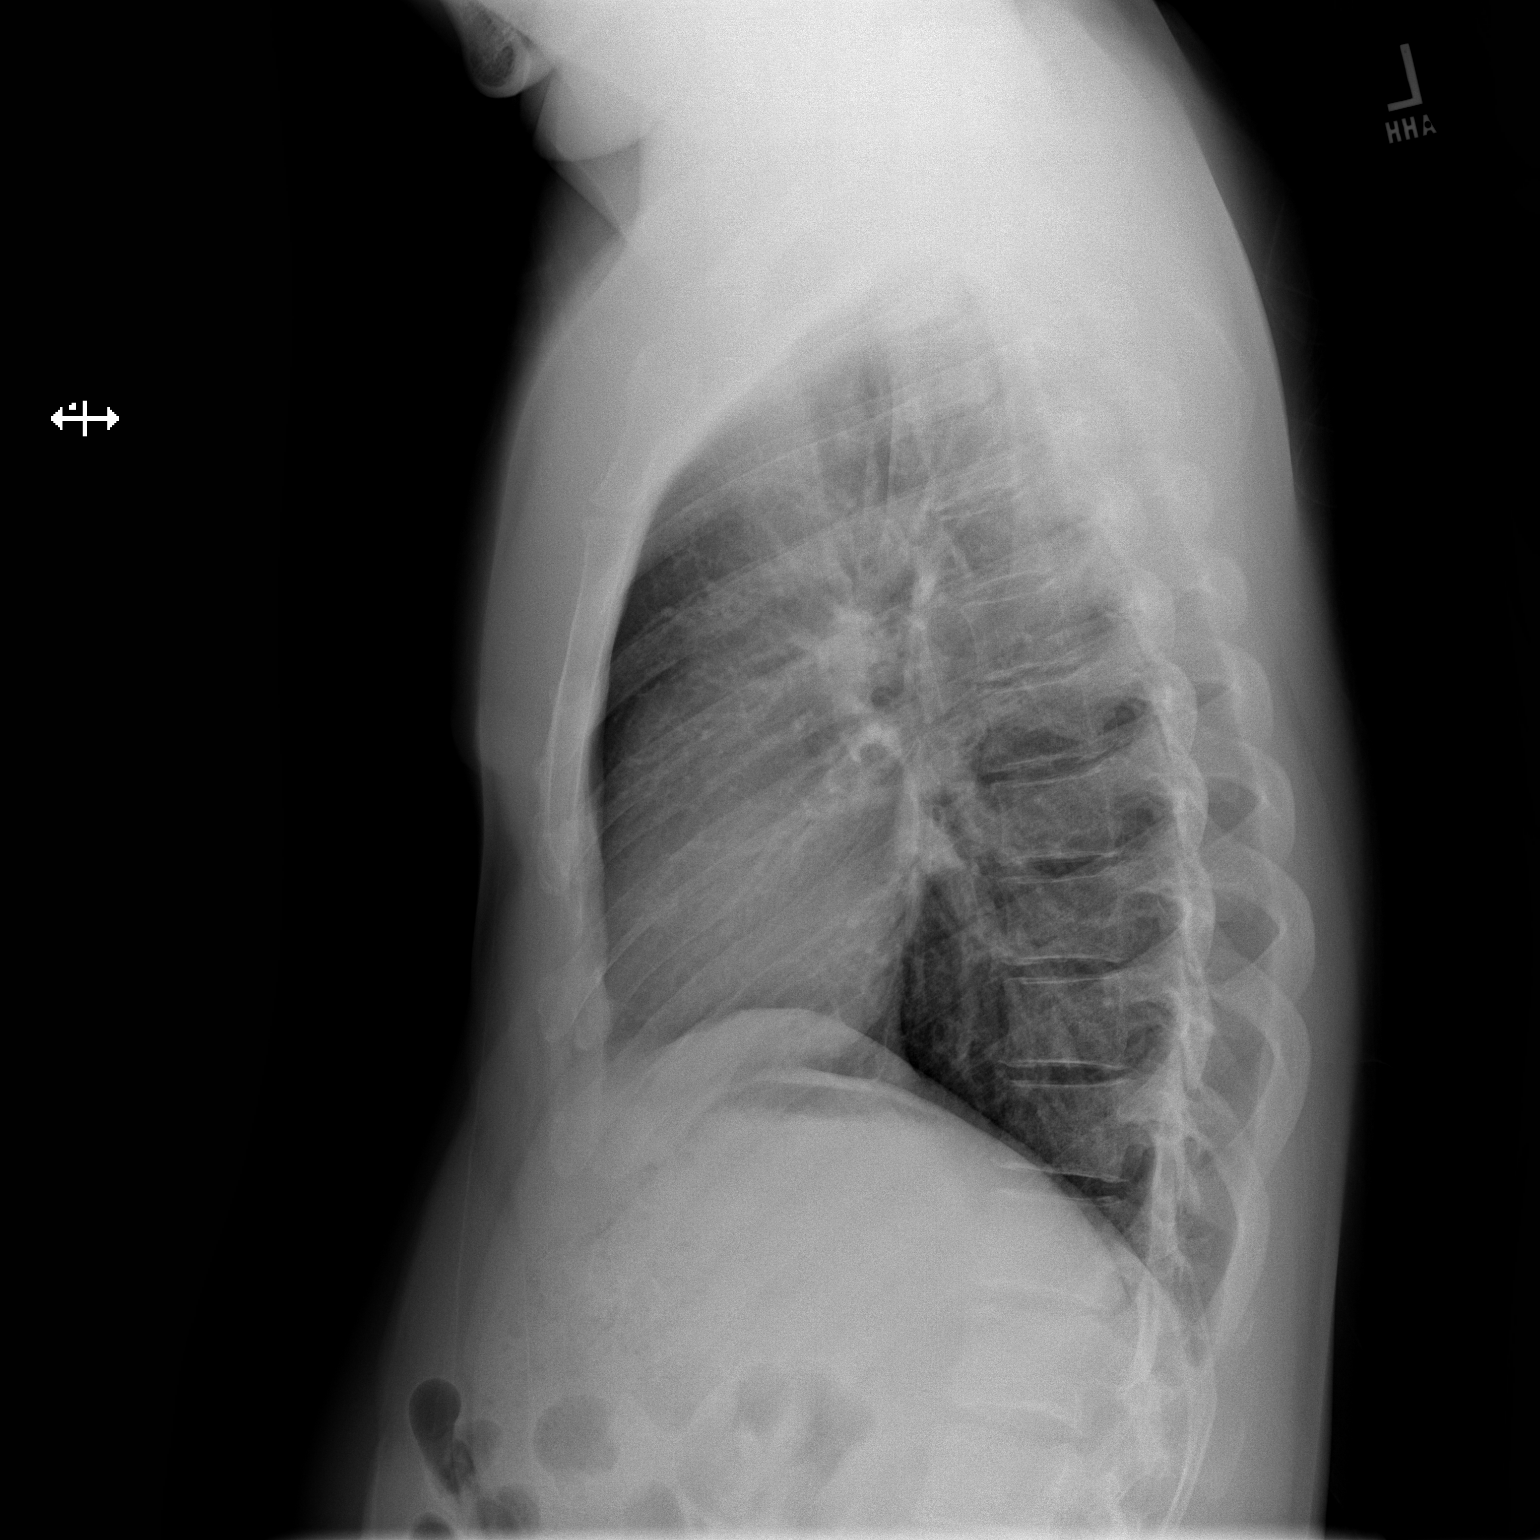

[2 of 2 positions shown; findings below may reference images not displayed]

FINDINGS: Cardiomediastinal silhouette is normal. No pleural effusions or
focal consolidations. Trachea projects midline and there is no
pneumothorax. Soft tissue planes and included osseous structures are
non-suspicious.
IMPRESSION: Negative.

## 2019-04-06 ENCOUNTER — Telehealth: Payer: Self-pay

## 2019-04-06 ENCOUNTER — Other Ambulatory Visit: Payer: Self-pay | Admitting: Internal Medicine

## 2019-04-10 ENCOUNTER — Telehealth: Payer: Self-pay

## 2019-04-10 ENCOUNTER — Other Ambulatory Visit: Payer: Self-pay | Admitting: Internal Medicine

## 2019-04-10 MED ORDER — CLONAZEPAM 0.5 MG PO TABS: 0.5000 mg | ORAL_TABLET | Freq: Two times a day (BID) | ORAL | 0 refills | Status: DC | PRN

## 2019-04-10 NOTE — Telephone Encounter (Signed)
Refilled

## 2019-04-11 NOTE — Telephone Encounter (Signed)
Refilled by provider

## 2019-05-09 ENCOUNTER — Telehealth: Payer: Self-pay

## 2019-05-09 ENCOUNTER — Other Ambulatory Visit: Payer: Self-pay | Admitting: Internal Medicine

## 2019-05-09 MED ORDER — CLONAZEPAM 0.5 MG PO TABS: 0.5000 mg | ORAL_TABLET | Freq: Two times a day (BID) | ORAL | 0 refills | Status: DC | PRN

## 2019-05-09 NOTE — Telephone Encounter (Signed)
Refilled

## 2019-06-09 ENCOUNTER — Telehealth: Payer: Self-pay

## 2019-06-10 ENCOUNTER — Other Ambulatory Visit: Payer: Self-pay | Admitting: Internal Medicine

## 2019-06-10 MED ORDER — CLONAZEPAM 0.5 MG PO TABS: 0.5000 mg | ORAL_TABLET | Freq: Two times a day (BID) | ORAL | 0 refills | Status: DC | PRN

## 2019-06-10 NOTE — Telephone Encounter (Signed)
Refilled

## 2019-07-08 ENCOUNTER — Telehealth: Payer: Self-pay

## 2019-07-10 ENCOUNTER — Other Ambulatory Visit: Payer: Self-pay | Admitting: Internal Medicine

## 2019-07-10 MED ORDER — CLONAZEPAM 0.5 MG PO TABS: 0.5000 mg | ORAL_TABLET | Freq: Two times a day (BID) | ORAL | 0 refills | Status: DC | PRN

## 2019-07-10 NOTE — Telephone Encounter (Signed)
Refilled

## 2019-08-11 ENCOUNTER — Telehealth: Payer: Self-pay | Admitting: Internal Medicine

## 2019-08-11 ENCOUNTER — Other Ambulatory Visit: Payer: Self-pay | Admitting: Internal Medicine

## 2019-08-11 MED ORDER — CLONAZEPAM 0.5 MG PO TABS: 0.5000 mg | ORAL_TABLET | Freq: Two times a day (BID) | ORAL | 0 refills | Status: DC | PRN

## 2019-08-11 NOTE — Telephone Encounter (Signed)
Refilled

## 2019-09-13 ENCOUNTER — Telehealth: Payer: Self-pay | Admitting: Internal Medicine

## 2019-09-14 ENCOUNTER — Other Ambulatory Visit: Payer: Self-pay | Admitting: Internal Medicine

## 2019-09-14 ENCOUNTER — Telehealth: Payer: Self-pay | Admitting: Internal Medicine

## 2019-09-14 MED ORDER — CLONAZEPAM 0.5 MG PO TABS: 0.5000 mg | ORAL_TABLET | Freq: Two times a day (BID) | ORAL | 0 refills | Status: DC | PRN

## 2019-09-14 NOTE — Telephone Encounter (Signed)
done

## 2019-09-14 NOTE — Telephone Encounter (Signed)
Refilled

## 2019-09-14 NOTE — Telephone Encounter (Signed)
Refill request for clonazepam. Please advise.  

## 2019-10-12 ENCOUNTER — Other Ambulatory Visit: Payer: Self-pay | Admitting: Family Medicine

## 2019-10-12 ENCOUNTER — Telehealth: Payer: Self-pay

## 2019-10-12 DIAGNOSIS — F411 Generalized anxiety disorder: Secondary | ICD-10-CM

## 2019-10-12 MED ORDER — CLONAZEPAM 0.5 MG PO TABS: 0.5000 mg | ORAL_TABLET | Freq: Two times a day (BID) | ORAL | 0 refills | Status: DC | PRN

## 2019-10-12 NOTE — Telephone Encounter (Signed)
Patient is requesting a refill on clonazepam sent to [facility] on [address]. He is scheduled to follow up with you on 10/31/2019. Please advise

## 2019-10-12 NOTE — Progress Notes (Signed)
Reviewed PDMP, no inconsistencies noted.   Meds ordered this encounter  Medications  . clonazePAM (KLONOPIN) 0.5 MG tablet    Sig: Take 1 tablet (0.5 mg total) by mouth 2 (two) times daily as needed for anxiety.    Dispense:  60 tablet    Refill:  0    Order Specific Question:   Supervising Provider    Answer:   Tresa Garter [[id number]]    Donia Pounds  APRN, MSN, FNP-C [facility] [place] [address] [place], [place] [address] [phone]

## 2019-10-31 ENCOUNTER — Ambulatory Visit (INDEPENDENT_AMBULATORY_CARE_PROVIDER_SITE_OTHER): Payer: Self-pay | Admitting: Family Medicine

## 2019-10-31 ENCOUNTER — Encounter: Payer: Self-pay | Admitting: Family Medicine

## 2019-10-31 ENCOUNTER — Other Ambulatory Visit: Payer: Self-pay

## 2019-10-31 VITALS — BP 136/78 | HR 86 | Temp 98.5°F | Resp 14 | Ht 67.0 in | Wt 166.0 lb

## 2019-10-31 DIAGNOSIS — F411 Generalized anxiety disorder: Secondary | ICD-10-CM

## 2019-10-31 DIAGNOSIS — Z9109 Other allergy status, other than to drugs and biological substances: Secondary | ICD-10-CM

## 2019-10-31 DIAGNOSIS — I471 Supraventricular tachycardia: Secondary | ICD-10-CM

## 2019-10-31 DIAGNOSIS — J4520 Mild intermittent asthma, uncomplicated: Secondary | ICD-10-CM

## 2019-10-31 MED ORDER — LEVOCETIRIZINE DIHYDROCHLORIDE 5 MG PO TABS: 5.0000 mg | ORAL_TABLET | Freq: Every evening | ORAL | 5 refills | Status: DC

## 2019-10-31 NOTE — Patient Instructions (Signed)
Allergies, Adult An allergy means that your body reacts to something that bothers it (allergen). It is not a normal reaction. This can happen from something that you:  Eat.  Breathe in.  Touch. You can have an allergy (be allergic) to:  Outdoor things, like: ? Pollen. ? Grass. ? Weeds.  Indoor things, like: ? Dust. ? Smoke. ? Pet dander.  Foods.  Medicines.  Things that bother your skin, like: ? Detergents. ? Chemicals. ? Latex.  Perfume.  Bugs. An allergy cannot spread from person to person (is not contagious). Follow these instructions at home:         Stay away from things that you know you are allergic to.  If you have allergies to things in the air, wash out your nose each day. Do it with one of these: ? A salt-water (saline) spray. ? A container (neti pot).  Take over-the-counter and prescription medicines only as told by your doctor.  Keep all follow-up visits as told by your doctor. This is important.  If you are at risk for a very bad allergy reaction (anaphylaxis), keep an auto-injector with you all the time. This is called an epinephrine injection. ? This is pre-measured medicine with a needle. You can put it into your skin by yourself. ? Right after you have a very bad allergy reaction, you or a person with you must give the medicine in less than a few minutes. This is an emergency.  If you have ever had a very bad allergy reaction, wear a medical alert bracelet or necklace. Your very bad allergy should be written on it. Contact a health care provider if:  Your symptoms do not get better with treatment. Get help right away if:  You have symptoms of a very bad allergy reaction. These include: ? A swollen mouth, tongue, or throat. ? Pain or tightness in your chest. ? Trouble breathing. ? Being short of breath. ? Dizziness. ? Fainting. ? Very bad pain in your belly (abdomen). ? Throwing up (vomiting). ? Watery poop  (diarrhea). Summary  An allergy means that your body reacts to something that bothers it (allergen). It is not a normal reaction.  Stay away from things that make your body react.  Take over-the-counter and prescription medicines only as told by your doctor.  If you are at risk for a very bad allergy reaction, carry an auto-injector (epinephrine injection) all the time. Also, wear a medical alert bracelet or necklace so people know about your allergy. This information is not intended to replace advice given to you by your health care provider. Make sure you discuss any questions you have with your health care provider. Document Revised: 01/31/2019 Document Reviewed: 01/25/2017 Elsevier Patient Education  [place]. Generalized Anxiety Disorder, Adult Generalized anxiety disorder (GAD) is a mental health disorder. People with this condition constantly worry about everyday events. Unlike normal anxiety, worry related to GAD is not triggered by a specific event. These worries also do not fade or get better with time. GAD interferes with life functions, including relationships, work, and school. GAD can vary from mild to severe. People with severe GAD can have intense waves of anxiety with physical symptoms (panic attacks). What are the causes? The exact cause of GAD is not known. What increases the risk? This condition is more likely to develop in:  Women.  People who have a family history of anxiety disorders.  People who are very shy.  People who experience very stressful life  events, such as the death of a loved one.  People who have a very stressful family environment. What are the signs or symptoms? People with GAD often worry excessively about many things in their lives, such as their health and family. They may also be overly concerned about:  Doing well at work.  Being on time.  Natural disasters.  Friendships. Physical symptoms of GAD  include:  Fatigue.  Muscle tension or having muscle twitches.  Trembling or feeling shaky.  Being easily startled.  Feeling like your heart is pounding or racing.  Feeling out of breath or like you cannot take a deep breath.  Having trouble falling asleep or staying asleep.  Sweating.  Nausea, diarrhea, or irritable bowel syndrome (IBS).  Headaches.  Trouble concentrating or remembering facts.  Restlessness.  Irritability. How is this diagnosed? Your health care provider can diagnose GAD based on your symptoms and medical history. You will also have a physical exam. The health care provider will ask specific questions about your symptoms, including how severe they are, when they started, and if they come and go. Your health care provider may ask you about your use of alcohol or drugs, including prescription medicines. Your health care provider may refer you to a mental health specialist for further evaluation. Your health care provider will do a thorough examination and may perform additional tests to rule out other possible causes of your symptoms. To be diagnosed with GAD, a person must have anxiety that:  Is out of his or her control.  Affects several different aspects of his or her life, such as work and relationships.  Causes distress that makes him or her unable to take part in normal activities.  Includes at least three physical symptoms of GAD, such as restlessness, fatigue, trouble concentrating, irritability, muscle tension, or sleep problems. Before your health care provider can confirm a diagnosis of GAD, these symptoms must be present more days than they are not, and they must last for six months or longer. How is this treated? The following therapies are usually used to treat GAD:  Medicine. Antidepressant medicine is usually prescribed for long-term daily control. Antianxiety medicines may be added in severe cases, especially when panic attacks occur.  Talk  therapy (psychotherapy). Certain types of talk therapy can be helpful in treating GAD by providing support, education, and guidance. Options include: ? Cognitive behavioral therapy (CBT). People learn coping skills and techniques to ease their anxiety. They learn to identify unrealistic or negative thoughts and behaviors and to replace them with positive ones. ? Acceptance and commitment therapy (ACT). This treatment teaches people how to be mindful as a way to cope with unwanted thoughts and feelings. ? Biofeedback. This process trains you to manage your body's response (physiological response) through breathing techniques and relaxation methods. You will work with a therapist while machines are used to monitor your physical symptoms.  Stress management techniques. These include yoga, meditation, and exercise. A mental health specialist can help determine which treatment is best for you. Some people see improvement with one type of therapy. However, other people require a combination of therapies. Follow these instructions at home:  Take over-the-counter and prescription medicines only as told by your health care provider.  Try to maintain a normal routine.  Try to anticipate stressful situations and allow extra time to manage them.  Practice any stress management or self-calming techniques as taught by your health care provider.  Do not punish yourself for setbacks or for not  making progress.  Try to recognize your accomplishments, even if they are small.  Keep all follow-up visits as told by your health care provider. This is important. Contact a health care provider if:  Your symptoms do not get better.  Your symptoms get worse.  You have signs of depression, such as: ? A persistently sad, cranky, or irritable mood. ? Loss of enjoyment in activities that used to bring you joy. ? Change in weight or eating. ? Changes in sleeping habits. ? Avoiding friends or family  members. ? Loss of energy for normal tasks. ? Feelings of guilt or worthlessness. Get help right away if:  You have serious thoughts about hurting yourself or others. If you ever feel like you may hurt yourself or others, or have thoughts about taking your own life, get help right away. You can go to your nearest emergency department or call:  Your local emergency services (911 in the [place]  A suicide crisis helpline, such as the National Suicide Prevention Lifeline at 709-325-6328. This is open 24 hours a day. Summary  Generalized anxiety disorder (GAD) is a mental health disorder that involves worry that is not triggered by a specific event.  People with GAD often worry excessively about many things in their lives, such as their health and family.  GAD may cause physical symptoms such as restlessness, trouble concentrating, sleep problems, frequent sweating, nausea, diarrhea, headaches, and trembling or muscle twitching.  A mental health specialist can help determine which treatment is best for you. Some people see improvement with one type of therapy. However, other people require a combination of therapies. This information is not intended to replace advice given to you by your health care provider. Make sure you discuss any questions you have with your health care provider. Document Revised: 09/24/2017 Document Reviewed: 09/01/2016 Elsevier Patient Education  2020 [employer].

## 2019-10-31 NOTE — Progress Notes (Signed)
[facility]  Established Patient Office Visit  Subjective:  Patient ID: Jose Hall, male    DOB: 08/01/1986  Age: 34 y.o. MRN: [id number]  CC:  Chief Complaint  Patient presents with  . Anxiety   Makayla Confer, a 34 year old male with a medical history significant for generalized anxiety, and mild intermittent asthma presents for follow-up of chronic conditions.  Patient states that he has been doing well and is without complaint on today.  He states that he continues to use Klonopin periodically for generalized anxiety.  He endorses excessive worrying.  He says that he has not had a panic attack in greater than 1 year. Patient states that he is not had an asthma exacerbation over the past several years.  He states that since anxiety is now controlled, he has not had any problems with asthma.  Anxiety Presents for follow-up visit. Symptoms include excessive worry and nervous/anxious behavior. Patient reports no chest pain, compulsions, confusion, decreased concentration, depressed mood, hyperventilation, impotence, irritability, muscle tension, obsessions, palpitations, panic, shortness of breath or suicidal ideas.   His past medical history is significant for asthma.  Asthma There is no cough, difficulty breathing, hemoptysis, hoarse voice, shortness of breath, sputum production or wheezing. The current episode started 1 to 4 weeks ago. Pertinent negatives include no appetite change, chest pain, malaise/fatigue, nasal congestion, PND or postnasal drip. His symptoms are aggravated by exercise and exposure to fumes. His symptoms are alleviated by rest. His past medical history is significant for asthma.    Past Medical History:  Diagnosis Date  . Anxiety   . Asthma     History reviewed. No pertinent surgical history.  Family History  Problem Relation Age of Onset  . Hypertension Other   . Diabetes Other   . Asthma Father      Social History   Socioeconomic History  . Marital status: Single    Spouse name: Not on file  . Number of children: Not on file  . Years of education: Not on file  . Highest education level: Not on file  Occupational History  . Not on file  Tobacco Use  . Smoking status: Never Smoker  . Smokeless tobacco: Never Used  Substance and Sexual Activity  . Alcohol use: No  . Drug use: No  . Sexual activity: Yes  Other Topics Concern  . Not on file  Social History Narrative   ** Merged History Encounter **       Social Determinants of Health   Financial Resource Strain:   . Difficulty of Paying Living Expenses: Not on file  Food Insecurity:   . Worried About Charity fundraiser in the Last Year: Not on file  . Ran Out of Food in the Last Year: Not on file  Transportation Needs:   . Lack of Transportation (Medical): Not on file  . Lack of Transportation (Non-Medical): Not on file  Physical Activity:   . Days of Exercise per Week: Not on file  . Minutes of Exercise per Session: Not on file  Stress:   . Feeling of Stress : Not on file  Social Connections:   . Frequency of Communication with Friends and Family: Not on file  . Frequency of Social Gatherings with Friends and Family: Not on file  . Attends Religious Services: Not on file  . Active Member of Clubs or Organizations: Not on file  . Attends Archivist Meetings: Not on  file  . Marital Status: Not on file  Intimate Partner Violence:   . Fear of Current or Ex-Partner: Not on file  . Emotionally Abused: Not on file  . Physically Abused: Not on file  . Sexually Abused: Not on file    Outpatient Medications Prior to Visit  Medication Sig Dispense Refill  . clonazePAM (KLONOPIN) 0.5 MG tablet Take 1 tablet (0.5 mg total) by mouth 2 (two) times daily as needed for anxiety. 60 tablet 0   No facility-administered medications prior to visit.    No Known Allergies  ROS Review of Systems   Constitutional: Negative for appetite change, irritability and malaise/fatigue.  HENT: Negative for hoarse voice and postnasal drip.   Respiratory: Negative for cough, hemoptysis, sputum production, shortness of breath and wheezing.   Cardiovascular: Negative for chest pain, palpitations and PND.  Genitourinary: Negative for impotence.  Allergic/Immunologic: Positive for environmental allergies.  Psychiatric/Behavioral: Negative for confusion, decreased concentration and suicidal ideas. The patient is nervous/anxious.       Objective:    Physical Exam  Constitutional: He is oriented to person, place, and time. He appears well-developed and well-nourished.  HENT:  Head: Normocephalic and atraumatic.  Eyes: Pupils are equal, round, and reactive to light.  Cardiovascular: Normal rate, regular rhythm and normal heart sounds.  Pulmonary/Chest: Effort normal and breath sounds normal.  Abdominal: Soft. Bowel sounds are normal.  Musculoskeletal:        General: Normal range of motion.     Cervical back: Normal range of motion.  Neurological: He is alert and oriented to person, place, and time. He has normal reflexes.  Skin: Skin is warm and dry.  Psychiatric: He has a normal mood and affect. His behavior is normal. Judgment and thought content normal.    BP 136/78 (BP Location: Right Arm, Patient Position: Sitting, Cuff Size: Normal)   Pulse 86   Temp 98.5 F (36.9 C) (Oral)   Resp 14   Ht 5\' 7"  (1.702 m)   Wt 166 lb (75.3 kg)   SpO2 100%   BMI 26.00 kg/m  Wt Readings from Last 3 Encounters:  10/31/19 166 lb (75.3 kg)  11/15/18 164 lb (74.4 kg)  09/06/18 164 lb (74.4 kg)   Lab Results  Component Value Date   TSH 0.430 01/28/2018   Lab Results  Component Value Date   WBC 8.7 01/28/2018   HGB 15.8 01/28/2018   HCT 42.8 01/28/2018   MCV 80.0 01/28/2018   PLT 286 01/28/2018   Lab Results  Component Value Date   NA 139 01/28/2018   K 4.4 01/28/2018   CO2 24 01/28/2018    GLUCOSE 182 (H) 01/28/2018   BUN 13 01/28/2018   CREATININE 1.17 01/28/2018   BILITOT 1.3 (H) 01/27/2016   ALKPHOS 50 01/27/2016   AST 28 01/27/2016   ALT 42 01/27/2016   PROT 8.1 01/27/2016   ALBUMIN 4.6 01/27/2016   CALCIUM 9.3 01/28/2018   ANIONGAP 7 01/28/2018   No results found for: CHOL No results found for: HDL No results found for: LDLCALC No results found for: TRIG No results found for: CHOLHDL No results found for: 03/30/2018    Assessment & Plan:   Problem List Items Addressed This Visit      Cardiovascular and Mediastinum   SVT (supraventricular tachycardia) (HCC)     Other   Generalized anxiety disorder - Primary    Other Visit Diagnoses    Mild intermittent asthma without complication  Relevant Medications   albuterol (VENTOLIN HFA) 108 (90 Base) MCG/ACT inhaler   Environmental allergies       Relevant Medications   levocetirizine (XYZAL) 5 MG tablet     Generalized anxiety disorder Patient denies suicidal or homicidal ideations. Will continue Clonazepam 0.5 mg, 2 times daily as needed   Mild intermittent asthma without complication - albuterol (VENTOLIN HFA) 108 (90 Base) MCG/ACT inhaler; Inhale 2 puffs into the lungs every 6 (six) hours as needed for wheezing or shortness of breath.  Dispense: 6.7 g; Refill: 0   Environmental allergies - levocetirizine (XYZAL) 5 MG tablet; Take 1 tablet (5 mg total) by mouth every evening.  Dispense: 30 tablet; Refill: 5  Follow-up: Return in about 6 months (around 04/29/2020).    Nolon Nations  APRN, MSN, FNP-C Patient Care [facility] [address] [place], [place] [address] [phone]

## 2019-11-13 ENCOUNTER — Telehealth: Payer: Self-pay | Admitting: Family Medicine

## 2019-11-14 NOTE — Telephone Encounter (Signed)
error 

## 2019-12-13 ENCOUNTER — Telehealth: Payer: Self-pay | Admitting: Family Medicine

## 2019-12-13 ENCOUNTER — Other Ambulatory Visit: Payer: Self-pay | Admitting: Family Medicine

## 2019-12-13 DIAGNOSIS — F411 Generalized anxiety disorder: Secondary | ICD-10-CM

## 2019-12-13 NOTE — Telephone Encounter (Signed)
error 

## 2019-12-13 NOTE — Telephone Encounter (Signed)
Refill request for clonazepam. Please advise. Thanks!

## 2020-01-15 ENCOUNTER — Other Ambulatory Visit: Payer: Self-pay | Admitting: Family Medicine

## 2020-01-15 DIAGNOSIS — F411 Generalized anxiety disorder: Secondary | ICD-10-CM

## 2020-01-15 NOTE — Telephone Encounter (Signed)
Refill request for clonazepam. Please advise.  

## 2020-02-15 ENCOUNTER — Other Ambulatory Visit: Payer: Self-pay | Admitting: Family Medicine

## 2020-02-15 DIAGNOSIS — F411 Generalized anxiety disorder: Secondary | ICD-10-CM

## 2020-02-15 NOTE — Telephone Encounter (Signed)
Refill request for clonazepam. Please advise. Thanks!

## 2020-03-15 ENCOUNTER — Other Ambulatory Visit: Payer: Self-pay | Admitting: Family Medicine

## 2020-03-15 DIAGNOSIS — F411 Generalized anxiety disorder: Secondary | ICD-10-CM

## 2020-04-15 ENCOUNTER — Other Ambulatory Visit: Payer: Self-pay | Admitting: Family Medicine

## 2020-04-15 DIAGNOSIS — F411 Generalized anxiety disorder: Secondary | ICD-10-CM

## 2020-04-30 ENCOUNTER — Ambulatory Visit: Payer: Self-pay | Admitting: Family Medicine

## 2020-05-14 ENCOUNTER — Encounter: Payer: Self-pay | Admitting: Family Medicine

## 2020-05-14 ENCOUNTER — Other Ambulatory Visit: Payer: Self-pay

## 2020-05-14 ENCOUNTER — Ambulatory Visit (INDEPENDENT_AMBULATORY_CARE_PROVIDER_SITE_OTHER): Payer: Self-pay | Admitting: Family Medicine

## 2020-05-14 VITALS — BP 140/78 | HR 85 | Temp 97.9°F | Resp 16 | Ht 67.0 in | Wt 169.0 lb

## 2020-05-14 DIAGNOSIS — F411 Generalized anxiety disorder: Secondary | ICD-10-CM

## 2020-05-14 MED ORDER — CLONAZEPAM 0.5 MG PO TABS: 0.5000 mg | ORAL_TABLET | Freq: Two times a day (BID) | ORAL | 0 refills | Status: DC | PRN

## 2020-05-14 NOTE — Patient Instructions (Signed)

## 2020-05-14 NOTE — Progress Notes (Signed)
[facility]   Established Patient Office Visit  Subjective:  Patient ID: Jose Hall, male    DOB: 1986/01/08  Age: 34 y.o. MRN: [id number]  CC:  Chief Complaint  Patient presents with  . Anxiety    6 month follow up    Jose Hall is a 34 year old male with a medical history significant for generalized anxiety disorder presents for follow up.   Anxiety Presents for follow-up visit. Patient reports no chest pain, compulsions, confusion, decreased concentration, depressed mood, dizziness, dry mouth, excessive worry, feeling of choking, hyperventilation, malaise, muscle tension, nausea, nervous/anxious behavior, obsessions, palpitations, panic, restlessness or shortness of breath. Symptoms occur constantly. The severity of symptoms is mild. The quality of sleep is good.       Past Medical History:  Diagnosis Date  . Anxiety   . Asthma     History reviewed. No pertinent surgical history.  Family History  Problem Relation Age of Onset  . Hypertension Other   . Diabetes Other   . Asthma Father     Social History   Socioeconomic History  . Marital status: Single    Spouse name: Not on file  . Number of children: Not on file  . Years of education: Not on file  . Highest education level: Not on file  Occupational History  . Not on file  Tobacco Use  . Smoking status: Never Smoker  . Smokeless tobacco: Never Used  Vaping Use  . Vaping Use: Never used  Substance and Sexual Activity  . Alcohol use: No  . Drug use: No  . Sexual activity: Yes  Other Topics Concern  . Not on file  Social History Narrative   ** Merged History Encounter **       Social Determinants of Health   Financial Resource Strain:   . Difficulty of Paying Living Expenses:   Food Insecurity:   . Worried About Programme researcher, broadcasting/film/video in the Last Year:   . Barista in the Last Year:   Transportation Needs:   . Freight forwarder  (Medical):   Marland Kitchen Lack of Transportation (Non-Medical):   Physical Activity:   . Days of Exercise per Week:   . Minutes of Exercise per Session:   Stress:   . Feeling of Stress :   Social Connections:   . Frequency of Communication with Friends and Family:   . Frequency of Social Gatherings with Friends and Family:   . Attends Religious Services:   . Active Member of Clubs or Organizations:   . Attends Banker Meetings:   Marland Kitchen Marital Status:   Intimate Partner Violence:   . Fear of Current or Ex-Partner:   . Emotionally Abused:   Marland Kitchen Physically Abused:   . Sexually Abused:     Outpatient Medications Prior to Visit  Medication Sig Dispense Refill  . clonazePAM (KLONOPIN) 0.5 MG tablet TAKE 1 TABLET (0.5 MG TOTAL) BY MOUTH 2 (TWO) TIMES DAILY AS NEEDED FOR ANXIETY. 60 tablet 0  . albuterol (VENTOLIN HFA) 108 (90 Base) MCG/ACT inhaler Inhale 2 puffs into the lungs every 6 (six) hours as needed for wheezing or shortness of breath. (Patient not taking: Reported on 05/14/2020) 6.7 g 0  . levocetirizine (XYZAL) 5 MG tablet Take 1 tablet (5 mg total) by mouth every evening. (Patient not taking: Reported on 05/14/2020) 30 tablet 5   No facility-administered medications prior to visit.  No Known Allergies  ROS Review of Systems  Constitutional: Negative.   HENT: Negative.   Eyes: Negative.   Respiratory: Negative for shortness of breath.   Cardiovascular: Negative for chest pain and palpitations.  Gastrointestinal: Negative for nausea.  Endocrine: Negative.   Genitourinary: Negative.   Musculoskeletal: Negative.   Skin: Negative.   Neurological: Negative for dizziness.  Psychiatric/Behavioral: Negative for confusion and decreased concentration. The patient is not nervous/anxious.       Objective:    Physical Exam Constitutional:      Appearance: Normal appearance. He is obese.  Eyes:     Pupils: Pupils are equal, round, and reactive to light.  Cardiovascular:      Rate and Rhythm: Normal rate and regular rhythm.     Pulses: Normal pulses.     Heart sounds: Normal heart sounds.  Pulmonary:     Effort: Pulmonary effort is normal.     Breath sounds: Normal breath sounds.  Abdominal:     General: Abdomen is flat. Bowel sounds are normal.  Musculoskeletal:        General: Normal range of motion.  Skin:    General: Skin is warm.  Neurological:     General: No focal deficit present.     Mental Status: He is alert. Mental status is at baseline.  Psychiatric:        Mood and Affect: Mood normal.        Thought Content: Thought content normal.        Judgment: Judgment normal.     BP (!) 146/87 (BP Location: Right Arm, Patient Position: Sitting, Cuff Size: Normal)   Pulse 85   Temp 97.9 F (36.6 C) (Skin)   Resp 16   Ht 5\' 7"  (1.702 m)   Wt 169 lb (76.7 kg)   SpO2 99%   BMI 26.47 kg/m  Wt Readings from Last 3 Encounters:  05/14/20 169 lb (76.7 kg)  10/31/19 166 lb (75.3 kg)  11/15/18 164 lb (74.4 kg)     Health Maintenance Due  Topic Date Due  . Hepatitis C Screening  Never done  . TETANUS/TDAP  01/30/2015    There are no preventive care reminders to display for this patient.  Lab Results  Component Value Date   TSH 0.430 01/28/2018   Lab Results  Component Value Date   WBC 8.7 01/28/2018   HGB 15.8 01/28/2018   HCT 42.8 01/28/2018   MCV 80.0 01/28/2018   PLT 286 01/28/2018   Lab Results  Component Value Date   NA 139 01/28/2018   K 4.4 01/28/2018   CO2 24 01/28/2018   GLUCOSE 182 (H) 01/28/2018   BUN 13 01/28/2018   CREATININE 1.17 01/28/2018   BILITOT 1.3 (H) 01/27/2016   ALKPHOS 50 01/27/2016   AST 28 01/27/2016   ALT 42 01/27/2016   PROT 8.1 01/27/2016   ALBUMIN 4.6 01/27/2016   CALCIUM 9.3 01/28/2018   ANIONGAP 7 01/28/2018   No results found for: CHOL No results found for: HDL No results found for: LDLCALC No results found for: TRIG No results found for: CHOLHDL No results found for: 03/30/2018      Assessment & Plan:   Problem List Items Addressed This Visit    None      No orders of the defined types were placed in this encounter.   Follow-up: Return in about 6 months (around 11/14/2020).    11/16/2020  APRN, MSN, FNP-C [facility] [address] [place], [place] [address] [phone]

## 2020-07-29 ENCOUNTER — Other Ambulatory Visit: Payer: Self-pay | Admitting: Family Medicine

## 2020-07-29 DIAGNOSIS — F411 Generalized anxiety disorder: Secondary | ICD-10-CM

## 2020-09-25 ENCOUNTER — Other Ambulatory Visit: Payer: Self-pay | Admitting: Family Medicine

## 2020-09-25 DIAGNOSIS — F411 Generalized anxiety disorder: Secondary | ICD-10-CM

## 2020-09-26 ENCOUNTER — Other Ambulatory Visit: Payer: Self-pay | Admitting: Family Medicine

## 2020-09-26 ENCOUNTER — Telehealth: Payer: Self-pay | Admitting: Family Medicine

## 2020-09-26 DIAGNOSIS — F411 Generalized anxiety disorder: Secondary | ICD-10-CM

## 2020-09-26 MED ORDER — CLONAZEPAM 0.5 MG PO TABS: 0.5000 mg | ORAL_TABLET | Freq: Two times a day (BID) | ORAL | 0 refills | Status: DC | PRN

## 2020-09-26 NOTE — Progress Notes (Signed)
Reviewed PDMP substance reporting system prior to prescribing opiate medications. No inconsistencies noted.   Meds ordered this encounter  Medications  . clonazePAM (KLONOPIN) 0.5 MG tablet    Sig: Take 1 tablet (0.5 mg total) by mouth 2 (two) times daily as needed for anxiety.    Dispense:  60 tablet    Refill:  0    Not to exceed 5 additional fills before 11/10/2020 DX Code Needed  .    Order Specific Question:   Supervising Provider    Answer:   Quentin Angst [[id number]]    Nolon Nations  APRN, MSN, FNP-C Patient Care [facility] [address] [place], [place] [address] [phone]

## 2020-09-27 NOTE — Telephone Encounter (Signed)
Done

## 2020-11-21 ENCOUNTER — Other Ambulatory Visit: Payer: Self-pay | Admitting: Family Medicine

## 2020-11-21 DIAGNOSIS — F411 Generalized anxiety disorder: Secondary | ICD-10-CM

## 2021-01-22 ENCOUNTER — Telehealth: Payer: Self-pay | Admitting: Family Medicine

## 2021-01-22 ENCOUNTER — Other Ambulatory Visit: Payer: Self-pay | Admitting: Family Medicine

## 2021-01-22 DIAGNOSIS — F411 Generalized anxiety disorder: Secondary | ICD-10-CM

## 2021-01-22 MED ORDER — CLONAZEPAM 0.5 MG PO TABS: 0.5000 mg | ORAL_TABLET | Freq: Two times a day (BID) | ORAL | 0 refills | Status: DC | PRN

## 2021-01-22 NOTE — Progress Notes (Signed)
Reviewed PDMP substance reporting system prior to prescribing opiate medications. No inconsistencies noted.   Meds ordered this encounter  Medications  . clonazePAM (KLONOPIN) 0.5 MG tablet    Sig: Take 1 tablet (0.5 mg total) by mouth 2 (two) times daily as needed for anxiety.    Dispense:  60 tablet    Refill:  0    Not to exceed 5 additional fills before 03/25/2021 DX Code Needed  PATIENT REQUESTING.    Order Specific Question:   Supervising Provider    Answer:   Quentin Angst [[id number]]    Nolon Nations  APRN, MSN, FNP-C Patient Care [facility] [address] [place], [place] [address] [phone]

## 2021-01-24 NOTE — Telephone Encounter (Signed)
Done

## 2021-01-28 ENCOUNTER — Telehealth (INDEPENDENT_AMBULATORY_CARE_PROVIDER_SITE_OTHER): Payer: Self-pay | Admitting: Family Medicine

## 2021-01-28 DIAGNOSIS — F411 Generalized anxiety disorder: Secondary | ICD-10-CM

## 2021-01-28 NOTE — Progress Notes (Signed)
Jose Hall is a 35 year old male with a medical history significant for generalized anxiety disorder that was scheduled for video visit.  Patient spoke with CMA and was checked then.  Unfortunately, patient did not answer after several text messages sent per epic.  Also, attempted to call patient and left voicemail.  Will attempt to reschedule patient at a later date.   Nolon Nations  APRN, MSN, FNP-C Patient Care [facility] [address] [place], [place] [address] [phone]

## 2021-03-26 ENCOUNTER — Telehealth: Payer: Self-pay

## 2021-03-26 ENCOUNTER — Other Ambulatory Visit: Payer: Self-pay | Admitting: Internal Medicine

## 2021-03-26 DIAGNOSIS — F411 Generalized anxiety disorder: Secondary | ICD-10-CM

## 2021-03-26 MED ORDER — CLONAZEPAM 0.5 MG PO TABS: 0.5000 mg | ORAL_TABLET | Freq: Two times a day (BID) | ORAL | 0 refills | Status: DC | PRN

## 2021-03-26 NOTE — Telephone Encounter (Signed)
Med  Refill Clonazepan

## 2021-05-27 ENCOUNTER — Other Ambulatory Visit: Payer: Self-pay | Admitting: Internal Medicine

## 2021-05-27 DIAGNOSIS — F411 Generalized anxiety disorder: Secondary | ICD-10-CM

## 2021-05-27 NOTE — Telephone Encounter (Signed)
Requested medication (s) are due for refill today: yes  Requested medication (s) are on the active medication list: yes  Last refill:  01/14/21  Future visit scheduled: no  Notes to clinic:  Request sent to wrong practice   Requested Prescriptions  Pending Prescriptions Disp Refills   clonazePAM (KLONOPIN) 0.5 MG tablet [Pharmacy Med Name: CLONAZEPAM 0.5 MG TABLET] 60 tablet 0    Sig: TAKE 1 TABLET BY MOUTH 2 TIMES DAILY AS NEEDED FOR ANXIETY.      There is no refill protocol information for this order

## 2021-05-28 ENCOUNTER — Other Ambulatory Visit: Payer: Self-pay | Admitting: Internal Medicine

## 2021-05-28 ENCOUNTER — Telehealth: Payer: Self-pay

## 2021-05-28 DIAGNOSIS — F411 Generalized anxiety disorder: Secondary | ICD-10-CM

## 2021-05-28 MED ORDER — CLONAZEPAM 0.5 MG PO TABS: 0.5000 mg | ORAL_TABLET | Freq: Two times a day (BID) | ORAL | 0 refills | Status: DC | PRN

## 2021-05-28 NOTE — Telephone Encounter (Signed)
Refill clonazepam

## 2021-05-29 NOTE — Telephone Encounter (Signed)
Patient notified

## 2021-06-24 ENCOUNTER — Encounter: Payer: Self-pay | Admitting: Family Medicine

## 2021-06-24 ENCOUNTER — Ambulatory Visit (INDEPENDENT_AMBULATORY_CARE_PROVIDER_SITE_OTHER): Payer: Self-pay | Admitting: Family Medicine

## 2021-06-24 ENCOUNTER — Other Ambulatory Visit: Payer: Self-pay

## 2021-06-24 VITALS — BP 134/87 | HR 79 | Temp 98.2°F | Ht 67.0 in | Wt 171.0 lb

## 2021-06-24 DIAGNOSIS — Z09 Encounter for follow-up examination after completed treatment for conditions other than malignant neoplasm: Secondary | ICD-10-CM

## 2021-06-24 DIAGNOSIS — F411 Generalized anxiety disorder: Secondary | ICD-10-CM

## 2021-06-24 NOTE — Progress Notes (Signed)
[facility]   Established Patient Office Visit  Subjective:  Patient ID: Jose Hall, male    DOB: 1986/07/12  Age: 35 y.o. MRN: [id number]  CC:  Chief Complaint  Patient presents with   Follow-up    Pt is here today for his follow up visit. Pt has no concerns or issues to discuss today.    HPI Jose Hall is a very pleasant 35 year old male with a medical history significant for generalized anxiety that presents for follow-up.  Patient states that he has been doing well and is without complaint on today.  He says that anxiety has been well controlled on Klonopin.  He has not had any exacerbations of anxiety.  He denies any suicidal homicidal ideations on today.  He states that most of his anxiety is centered around the death of his brother several years ago.  He says that he has been coping well lately and has identified his triggers, which is going to his church.  Anxiety Presents for follow-up visit. Patient reports no chest pain, depressed mood, excessive worry, insomnia, irritability, malaise, nervous/anxious behavior, palpitations or restlessness. Symptoms occur occasionally. The severity of symptoms is mild. The quality of sleep is fair. Nighttime awakenings: none.      Past Medical History:  Diagnosis Date   Anxiety    Asthma     History reviewed. No pertinent surgical history.  Family History  Problem Relation Age of Onset   Hypertension Other    Diabetes Other    Asthma Father     Social History   Socioeconomic History   Marital status: Single    Spouse name: Not on file   Number of children: Not on file   Years of education: Not on file   Highest education level: Not on file  Occupational History   Not on file  Tobacco Use   Smoking status: Never   Smokeless tobacco: Never  Vaping Use   Vaping Use: Never used  Substance and Sexual Activity   Alcohol use: No   Drug use: No   Sexual activity: Not  Currently  Other Topics Concern   Not on file  Social History Narrative   ** Merged History Encounter **       Social Determinants of Health   Financial Resource Strain: Not on file  Food Insecurity: Not on file  Transportation Needs: Not on file  Physical Activity: Not on file  Stress: Not on file  Social Connections: Not on file  Intimate Partner Violence: Not on file    Outpatient Medications Prior to Visit  Medication Sig Dispense Refill   clonazePAM (KLONOPIN) 0.5 MG tablet Take 1 tablet (0.5 mg total) by mouth 2 (two) times daily as needed for anxiety. 60 tablet 0   No facility-administered medications prior to visit.    No Known Allergies  ROS Review of Systems  Constitutional: Negative.  Negative for irritability.  HENT: Negative.    Respiratory: Negative.    Cardiovascular: Negative.  Negative for chest pain and palpitations.  Genitourinary: Negative.   Skin: Negative.   Psychiatric/Behavioral: Negative.  The patient is not nervous/anxious and does not have insomnia.      Objective:    Physical Exam Constitutional:      Appearance: Normal appearance.  Eyes:     Pupils: Pupils are equal, round, and reactive to light.  Cardiovascular:     Rate and Rhythm: Normal rate and regular rhythm.  Pulmonary:  Effort: Pulmonary effort is normal.  Abdominal:     General: Bowel sounds are normal.  Musculoskeletal:        General: Normal range of motion.  Skin:    General: Skin is warm.  Neurological:     Mental Status: Mental status is at baseline.  Psychiatric:        Mood and Affect: Mood normal.        Behavior: Behavior normal.        Thought Content: Thought content normal.    BP 134/87   Pulse 79   Temp 98.2 F (36.8 C)   Ht 5\' 7"  (1.702 m)   Wt 171 lb (77.6 kg)   SpO2 99%   BMI 26.78 kg/m  Wt Readings from Last 3 Encounters:  06/24/21 171 lb (77.6 kg)  05/14/20 169 lb (76.7 kg)  10/31/19 166 lb (75.3 kg)     Health Maintenance Due   Topic Date Due   Hepatitis C Screening  Never done   TETANUS/TDAP  01/30/2015   INFLUENZA VACCINE  05/26/2021    There are no preventive care reminders to display for this patient.  Lab Results  Component Value Date   TSH 0.430 01/28/2018   Lab Results  Component Value Date   WBC 8.7 01/28/2018   HGB 15.8 01/28/2018   HCT 42.8 01/28/2018   MCV 80.0 01/28/2018   PLT 286 01/28/2018   Lab Results  Component Value Date   NA 139 01/28/2018   K 4.4 01/28/2018   CO2 24 01/28/2018   GLUCOSE 182 (H) 01/28/2018   BUN 13 01/28/2018   CREATININE 1.17 01/28/2018   BILITOT 1.3 (H) 01/27/2016   ALKPHOS 50 01/27/2016   AST 28 01/27/2016   ALT 42 01/27/2016   PROT 8.1 01/27/2016   ALBUMIN 4.6 01/27/2016   CALCIUM 9.3 01/28/2018   ANIONGAP 7 01/28/2018   No results found for: CHOL No results found for: HDL No results found for: LDLCALC No results found for: TRIG No results found for: CHOLHDL No results found for: 03/30/2018    Assessment & Plan:   Problem List Items Addressed This Visit   None Visit Diagnoses     Follow up    -  Primary   Relevant Orders   POCT URINALYSIS DIP (CLINITEK)      Generalized anxiety disorder:  GAD 7 : Generalized Anxiety Score 06/24/2021 05/14/2020 03/02/2018 01/17/2018  Nervous, Anxious, on Edge 1 0 0 -  Control/stop worrying 0 0 0 -  Worry too much - different things 0 0 0 -  Trouble relaxing 1 0 0 -  Restless 0 0 0 1  Easily annoyed or irritable 0 0 0 1  Afraid - awful might happen 0 0 0 0  Total GAD 7 Score 2 0 0 -     Follow-up: Return in about 6 months (around 12/23/2021).   12/25/2021  APRN, MSN, FNP-C Patient Care [facility] [address] [place], [place] [id number]

## 2021-06-24 NOTE — Patient Instructions (Signed)

## 2021-07-30 ENCOUNTER — Other Ambulatory Visit: Payer: Self-pay | Admitting: Internal Medicine

## 2021-07-30 DIAGNOSIS — F411 Generalized anxiety disorder: Secondary | ICD-10-CM

## 2021-07-30 NOTE — Telephone Encounter (Signed)
Requested medication (s) are due for refill today - yes  Requested medication (s) are on the active medication list -yes  Future visit scheduled -yes  Last refill: 05/28/21 #60  Notes to clinic: Request RF: outside provider  Requested Prescriptions  Pending Prescriptions Disp Refills   clonazePAM (KLONOPIN) 0.5 MG tablet [Pharmacy Med Name: CLONAZEPAM 0.5 MG TABLET] 60 tablet 0    Sig: TAKE 1 TABLET BY MOUTH 2 TIMES DAILY AS NEEDED FOR ANXIETY.     There is no refill protocol information for this order       Requested Prescriptions  Pending Prescriptions Disp Refills   clonazePAM (KLONOPIN) 0.5 MG tablet [Pharmacy Med Name: CLONAZEPAM 0.5 MG TABLET] 60 tablet 0    Sig: TAKE 1 TABLET BY MOUTH 2 TIMES DAILY AS NEEDED FOR ANXIETY.     There is no refill protocol information for this order

## 2021-10-13 ENCOUNTER — Telehealth: Payer: Self-pay

## 2021-10-13 ENCOUNTER — Other Ambulatory Visit: Payer: Self-pay | Admitting: Family Medicine

## 2021-10-13 DIAGNOSIS — F411 Generalized anxiety disorder: Secondary | ICD-10-CM

## 2021-10-13 NOTE — Telephone Encounter (Signed)
Clonazepam 

## 2021-10-14 ENCOUNTER — Other Ambulatory Visit: Payer: Self-pay | Admitting: Internal Medicine

## 2021-10-14 DIAGNOSIS — F411 Generalized anxiety disorder: Secondary | ICD-10-CM

## 2021-10-14 MED ORDER — CLONAZEPAM 0.5 MG PO TABS: 0.5000 mg | ORAL_TABLET | Freq: Two times a day (BID) | ORAL | 0 refills | Status: DC | PRN

## 2021-10-14 NOTE — Telephone Encounter (Signed)
Request forwarded to provider

## 2021-12-23 ENCOUNTER — Ambulatory Visit: Payer: Self-pay | Admitting: Nurse Practitioner

## 2021-12-29 ENCOUNTER — Other Ambulatory Visit: Payer: Self-pay

## 2021-12-29 ENCOUNTER — Ambulatory Visit (INDEPENDENT_AMBULATORY_CARE_PROVIDER_SITE_OTHER): Payer: Self-pay | Admitting: Nurse Practitioner

## 2021-12-29 ENCOUNTER — Encounter: Payer: Self-pay | Admitting: Nurse Practitioner

## 2021-12-29 VITALS — BP 137/91 | HR 84 | Temp 98.4°F | Ht 67.0 in | Wt 167.4 lb

## 2021-12-29 DIAGNOSIS — F411 Generalized anxiety disorder: Secondary | ICD-10-CM

## 2021-12-29 DIAGNOSIS — F419 Anxiety disorder, unspecified: Secondary | ICD-10-CM

## 2021-12-29 MED ORDER — CLONAZEPAM 0.5 MG PO TABS: 0.5000 mg | ORAL_TABLET | Freq: Two times a day (BID) | ORAL | 0 refills | Status: DC | PRN

## 2021-12-29 NOTE — Patient Instructions (Signed)
You were seen today in the [facility] for reevaluation of anxiety. You were prescribed medications, please take as directed. Please follow up in 6 mths for reevaluation of anxiety. ?

## 2021-12-29 NOTE — Progress Notes (Signed)
? ?[facility] ?[address] ?[place], [place]  [address] ?Phone:  [phone]   Fax:  [phone] ?Subjective:  ? Patient ID: Jose Hall, male    DOB: [birth year], 36 y.o.   MRN: [id number] ? ?Chief Complaint  ?Patient presents with  ? Follow-up  ?  Pt is here today for his 6 month follow up visit and states that he has not been taking his medications like he should. Pt states that he notice that when he doesn't take his medication he gets the night sweats and heart palpitations. Pt states that he is just trying to see if he can cope with his anxiety without taking the medication.  ? ?HPI ?Jose Hall 36 y.o. male  has a past medical history of Anxiety and Asthma.  To the [facility] for reevaluation of anxiety.  ? ?States that for the past week he has been alternating between taking Klonopin and Ashwaganda with similar results. States that one night, when he didn't take medication he began having nights sweats and palpitations, uncertain if this is related to not taking prescribed medication. Symptoms have only occurred once in the past week. Denies any counseling at this time. Denies any other complaints today. States that symptoms have drastically improved since taking medication.  ? ?Denies any fever. Denies any fatigue, chest pain, shortness of breath, HA or dizziness. Denies any blurred vision, numbness or tingling. ? ?Past Medical History:  ?Diagnosis Date  ? Anxiety   ? Asthma   ? ? ?History reviewed. No pertinent surgical history. ? ?Family History  ?Problem Relation Age of Onset  ? Hypertension Other   ? Diabetes Other   ? Asthma Father   ? ? ?Social History  ? ?Socioeconomic History  ? Marital status: Single  ?  Spouse name: Not on file  ? Number of children: Not on file  ? Years of education: Not on file  ? Highest education level: Not on file  ?Occupational History  ? Not on file  ?Tobacco Use  ? Smoking status: Never  ? Smokeless tobacco: Never  ?Vaping Use  ? Vaping Use: Never  used  ?Substance and Sexual Activity  ? Alcohol use: No  ? Drug use: No  ? Sexual activity: Not Currently  ?Other Topics Concern  ? Not on file  ?Social History Narrative  ? ** Merged History Encounter **  ?    ? ?Social Determinants of Health  ? ?Financial Resource Strain: Not on file  ?Food Insecurity: Not on file  ?Transportation Needs: Not on file  ?Physical Activity: Not on file  ?Stress: Not on file  ?Social Connections: Not on file  ?Intimate Partner Violence: Not on file  ? ? ?Outpatient Medications Prior to Visit  ?Medication Sig Dispense Refill  ? clonazePAM (KLONOPIN) 0.5 MG tablet Take 1 tablet (0.5 mg total) by mouth 2 (two) times daily as needed for anxiety. 60 tablet 0  ? ?No facility-administered medications prior to visit.  ? ? ?No Known Allergies ? ?Review of Systems  ?Constitutional:  Negative for chills, fever and malaise/fatigue.  ?Respiratory:  Negative for cough and shortness of breath.   ?Cardiovascular:  Negative for chest pain, palpitations and leg swelling.  ?Gastrointestinal:  Negative for abdominal pain, blood in stool, constipation, diarrhea, nausea and vomiting.  ?Skin: Negative.   ?Neurological: Negative.   ?Psychiatric/Behavioral:  Negative for depression. The patient is nervous/anxious.   ?All other systems reviewed and are negative. ? ?   ?Objective:  ?  ?  Physical Exam ?Vitals reviewed.  ?Constitutional:   ?   General: He is not in acute distress. ?   Appearance: Normal appearance. He is normal weight.  ?HENT:  ?   Head: Normocephalic.  ?Cardiovascular:  ?   Rate and Rhythm: Normal rate and regular rhythm.  ?   Pulses: Normal pulses.  ?   Heart sounds: Normal heart sounds.  ?   Comments: No obvious peripheral edema ?Pulmonary:  ?   Effort: Pulmonary effort is normal.  ?   Breath sounds: Normal breath sounds.  ?Musculoskeletal:     ?   General: No swelling, tenderness, deformity or signs of injury. Normal range of motion.  ?   Right lower leg: No edema.  ?   Left lower leg: No  edema.  ?Skin: ?   General: Skin is warm and dry.  ?   Capillary Refill: Capillary refill takes less than 2 seconds.  ?Neurological:  ?   Mental Status: He is alert.  ?Psychiatric:     ?   Mood and Affect: Mood normal.     ?   Behavior: Behavior normal.     ?   Thought Content: Thought content normal.     ?   Judgment: Judgment normal.  ? ? ?BP (!) 137/91 (BP Location: Left Arm, Cuff Size: Normal)   Pulse 84   Temp 98.4 ?F (36.9 ?C)   Ht 5\' 7"  (1.702 m)   Wt 167 lb 6.4 oz (75.9 kg)   SpO2 98%   BMI 26.22 kg/m?  ?Wt Readings from Last 3 Encounters:  ?12/29/21 167 lb 6.4 oz (75.9 kg)  ?06/24/21 171 lb (77.6 kg)  ?05/14/20 169 lb (76.7 kg)  ? ? ?Immunization History  ?Administered Date(s) Administered  ? Influenza,inj,Quad PF,6+ Mos 12/21/2017  ? Td 01/29/2005  ? ? ?Diabetic Foot Exam - Simple   ?No data filed ?  ? ? ?Lab Results  ?Component Value Date  ? TSH 0.430 01/28/2018  ? ?Lab Results  ?Component Value Date  ? WBC 8.7 01/28/2018  ? HGB 15.8 01/28/2018  ? HCT 42.8 01/28/2018  ? MCV 80.0 01/28/2018  ? PLT 286 01/28/2018  ? ?Lab Results  ?Component Value Date  ? NA 139 01/28/2018  ? K 4.4 01/28/2018  ? CO2 24 01/28/2018  ? GLUCOSE 182 (H) 01/28/2018  ? BUN 13 01/28/2018  ? CREATININE 1.17 01/28/2018  ? BILITOT 1.3 (H) 01/27/2016  ? ALKPHOS 50 01/27/2016  ? AST 28 01/27/2016  ? ALT 42 01/27/2016  ? PROT 8.1 01/27/2016  ? ALBUMIN 4.6 01/27/2016  ? CALCIUM 9.3 01/28/2018  ? ANIONGAP 7 01/28/2018  ? ?No results found for: CHOL ?No results found for: HDL ?No results found for: LDLCALC ?No results found for: TRIG ?No results found for: CHOLHDL ?No results found for: HGBA1C ? ?   ?Assessment & Plan:  ? ?Problem List Items Addressed This Visit   ? ?  ? Other  ? Generalized anxiety disorder  ? Relevant Medications  ? clonazePAM (KLONOPIN) 0.5 MG tablet, no changes to medication  ?Encouraged continued compliance  ?Discusses not pharmacological coping  ?Encouraged counseling in addition to medicine  ?Medication refilled    ? ?Other Visit Diagnoses   ? ? Anxiety    -  Primary  ? ?Follow up in 6 mths for reevaluation of anxiety, sooner as needed  ? ? ?I am having 03/30/2018 maintain his clonazePAM. ? ?Meds ordered this encounter  ?Medications  ? clonazePAM (KLONOPIN)  0.5 MG tablet  ?  Sig: Take 1 tablet (0.5 mg total) by mouth 2 (two) times daily as needed for anxiety.  ?  Dispense:  60 tablet  ?  Refill:  0  ? ? ? ?Kathrynn Speed, NP ?  ?

## 2022-05-04 ENCOUNTER — Other Ambulatory Visit: Payer: Self-pay | Admitting: Nurse Practitioner

## 2022-05-04 DIAGNOSIS — F411 Generalized anxiety disorder: Secondary | ICD-10-CM

## 2022-05-06 ENCOUNTER — Other Ambulatory Visit: Payer: Self-pay | Admitting: Nurse Practitioner

## 2022-05-06 DIAGNOSIS — F411 Generalized anxiety disorder: Secondary | ICD-10-CM

## 2022-07-03 ENCOUNTER — Encounter: Payer: Self-pay | Admitting: Nurse Practitioner

## 2022-07-03 ENCOUNTER — Ambulatory Visit (INDEPENDENT_AMBULATORY_CARE_PROVIDER_SITE_OTHER): Payer: Self-pay | Admitting: Nurse Practitioner

## 2022-07-03 ENCOUNTER — Ambulatory Visit: Payer: Self-pay | Admitting: Nurse Practitioner

## 2022-07-03 VITALS — BP 135/100 | HR 80 | Temp 98.2°F | Ht 67.0 in | Wt 166.0 lb

## 2022-07-03 DIAGNOSIS — Z0000 Encounter for general adult medical examination without abnormal findings: Secondary | ICD-10-CM | POA: Insufficient documentation

## 2022-07-03 NOTE — Progress Notes (Signed)
@Patient  ID: , male    DOB: Jun 18, 1986, 36 y.o.   MRN: [id number]  Chief Complaint  Patient presents with   Anxiety    Pt is here for  6 month follow up for anxiety.    Referring provider: No ref. provider found   HPI  BRYDAN DOWNARD 36 y.o. male  has a past medical history of Anxiety and Asthma.  To the [facility] for reevaluation of anxiety.   Anxiety has been well controlled - takes clonopin as needed for bad days - life stressors are improving. Not having to take clonopin as often. Overall doing well. Denies f/c/s, n/v/d, hemoptysis, PND, leg swelling Denies chest pain or edema      No Known Allergies  Immunization History  Administered Date(s) Administered   Influenza,inj,Quad PF,6+ Mos 12/21/2017   Td 01/29/2005    Past Medical History:  Diagnosis Date   Anxiety    Asthma     Tobacco History: Social History   Tobacco Use  Smoking Status Never  Smokeless Tobacco Never   Counseling given: Not Answered   Outpatient Encounter Medications as of 07/03/2022  Medication Sig   clonazePAM (KLONOPIN) 0.5 MG tablet TAKE 1 TABLET BY MOUTH 2 TIMES DAILY AS NEEDED FOR ANXIETY.   No facility-administered encounter medications on file as of 07/03/2022.     Review of Systems  Review of Systems  Constitutional: Negative.   HENT: Negative.    Cardiovascular: Negative.   Gastrointestinal: Negative.   Allergic/Immunologic: Negative.   Neurological: Negative.   Psychiatric/Behavioral: Negative.         Physical Exam  BP (!) 135/100 (BP Location: Right Arm, Patient Position: Sitting, Cuff Size: Large)   Pulse 80   Temp 98.2 F (36.8 C)   Ht 5\' 7"  (1.702 m)   Wt 166 lb (75.3 kg)   SpO2 100%   BMI 26.00 kg/m   Wt Readings from Last 5 Encounters:  07/03/22 166 lb (75.3 kg)  12/29/21 167 lb 6.4 oz (75.9 kg)  06/24/21 171 lb (77.6 kg)  05/14/20 169 lb (76.7 kg)  10/31/19 166 lb (75.3 kg)     Physical Exam Vitals and nursing note reviewed.   Constitutional:      General: He is not in acute distress.    Appearance: He is well-developed.  Cardiovascular:     Rate and Rhythm: Normal rate and regular rhythm.  Pulmonary:     Effort: Pulmonary effort is normal.     Breath sounds: Normal breath sounds.  Skin:    General: Skin is warm and dry.  Neurological:     Mental Status: He is alert and oriented to person, place, and time.      Lab Results:  CBC    Component Value Date/Time   WBC 8.7 01/28/2018 1642   RBC 5.35 01/28/2018 1642   HGB 15.8 01/28/2018 1642   HGB 16.5 12/21/2017 1139   HCT 42.8 01/28/2018 1642   HCT 46.9 12/21/2017 1139   PLT 286 01/28/2018 1642   PLT 305 12/21/2017 1139   MCV 80.0 01/28/2018 1642   MCV 83 12/21/2017 1139   MCH 29.5 01/28/2018 1642   MCHC 36.9 (H) 01/28/2018 1642   RDW 13.9 01/28/2018 1642   RDW 15.2 12/21/2017 1139   LYMPHSABS 3.2 01/08/2018 0330   LYMPHSABS 1.8 12/21/2017 1139   MONOABS 0.7 01/08/2018 0330   EOSABS 0.1 01/08/2018 0330   EOSABS 0.0 12/21/2017 1139   BASOSABS 0.0 01/08/2018 0330   BASOSABS  0.0 12/21/2017 1139    BMET    Component Value Date/Time   NA 139 01/28/2018 1642   NA 140 12/21/2017 1139   K 4.4 01/28/2018 1642   CL 108 01/28/2018 1642   CO2 24 01/28/2018 1642   GLUCOSE 182 (H) 01/28/2018 1642   BUN 13 01/28/2018 1642   BUN 22 (H) 12/21/2017 1139   CREATININE 1.17 01/28/2018 1642   CALCIUM 9.3 01/28/2018 1642   GFRNONAA >60 01/28/2018 1642   GFRAA >60 01/28/2018 1642    BNP No results found for: "BNP"  ProBNP No results found for: "PROBNP"  Imaging: No results found.   Assessment & Plan:   Routine health maintenance - CBC - Comprehensive metabolic panel  2. Anxiety  Continue current medication - use sparingly  Follow up:  Follow up in 3 months or sooner if needed     Ivonne Andrew, NP 07/03/2022

## 2022-07-03 NOTE — Assessment & Plan Note (Signed)
-   CBC - Comprehensive metabolic panel  2. Anxiety  Continue current medication - use sparingly  Follow up:  Follow up in 3 months or sooner if needed

## 2022-07-03 NOTE — Patient Instructions (Addendum)
1. Routine health maintenance  - CBC - Comprehensive metabolic panel  2. Anxiety  Continue current medication - use sparingly  Follow up:  Follow up in 3 months or sooner if needed

## 2022-07-04 LAB — COMPREHENSIVE METABOLIC PANEL
ALT: 39 IU/L (ref 0–44)
AST: 23 IU/L (ref 0–40)
Albumin/Globulin Ratio: 1.9 (ref 1.2–2.2)
Albumin: 4.6 g/dL (ref 4.1–5.1)
Alkaline Phosphatase: 48 IU/L (ref 44–121)
BUN/Creatinine Ratio: 19 (ref 9–20)
BUN: 21 mg/dL — ABNORMAL HIGH (ref 6–20)
Bilirubin Total: 0.4 mg/dL (ref 0.0–1.2)
CO2: 23 mmol/L (ref 20–29)
Calcium: 9.2 mg/dL (ref 8.7–10.2)
Chloride: 102 mmol/L (ref 96–106)
Creatinine, Ser: 1.09 mg/dL (ref 0.76–1.27)
Globulin, Total: 2.4 g/dL (ref 1.5–4.5)
Glucose: 76 mg/dL (ref 70–99)
Potassium: 4.1 mmol/L (ref 3.5–5.2)
Sodium: 139 mmol/L (ref 134–144)
Total Protein: 7 g/dL (ref 6.0–8.5)
eGFR: 90 mL/min/{1.73_m2} (ref >59)

## 2022-07-04 LAB — CBC
Hematocrit: 44.7 % (ref 37.5–51.0)
Hemoglobin: 15.5 g/dL (ref 13.0–17.7)
MCH: 28.9 pg (ref 26.6–33.0)
MCHC: 34.7 g/dL (ref 31.5–35.7)
MCV: 83 fL (ref 79–97)
Platelets: 243 10*3/uL (ref 150–450)
RBC: 5.37 x10E6/uL (ref 4.14–5.80)
RDW: 14.5 % (ref 11.6–15.4)
WBC: 7.6 10*3/uL (ref 3.4–10.8)

## 2022-12-01 ENCOUNTER — Telehealth: Payer: Self-pay | Admitting: Nurse Practitioner

## 2022-12-01 NOTE — Telephone Encounter (Signed)
Caller & Relationship to patient:  MRN #  [id number]   Call Back Number:   Date of Last Office Visit: 07/03/2022     Date of Next Office Visit: 01/01/2023    Medication(s) to be Refilled: clonazepam  Preferred Pharmacy: [facility]  ** Please notify patient to allow 48-72 hours to process** **Let patient know to contact pharmacy at the end of the day to make sure medication is ready. ** **If patient has not been seen in a year or longer, book an appointment **Advise to use MyChart for refill requests OR to contact their pharmacy

## 2022-12-02 ENCOUNTER — Other Ambulatory Visit: Payer: Self-pay | Admitting: Nurse Practitioner

## 2022-12-02 DIAGNOSIS — F411 Generalized anxiety disorder: Secondary | ICD-10-CM

## 2022-12-02 MED ORDER — CLONAZEPAM 0.5 MG PO TABS: 0.5000 mg | ORAL_TABLET | Freq: Two times a day (BID) | ORAL | 0 refills | Status: AC | PRN

## 2023-01-01 ENCOUNTER — Ambulatory Visit: Payer: Self-pay | Admitting: Nurse Practitioner

## 2023-01-08 ENCOUNTER — Ambulatory Visit: Payer: Self-pay | Admitting: Nurse Practitioner

## 2023-11-26 ENCOUNTER — Emergency Department (HOSPITAL_COMMUNITY)
Admission: EM | Admit: 2023-11-26 | Discharge: 2023-11-26 | Discharge status: Against Medical Advice | Payer: Self-pay | Attending: Emergency Medicine | Admitting: Emergency Medicine

## 2023-11-26 ENCOUNTER — Emergency Department (HOSPITAL_COMMUNITY): Payer: Self-pay

## 2023-11-26 DIAGNOSIS — Z5321 Procedure and treatment not carried out due to patient leaving prior to being seen by health care provider: Secondary | ICD-10-CM | POA: Insufficient documentation

## 2023-11-26 DIAGNOSIS — R109 Unspecified abdominal pain: Secondary | ICD-10-CM | POA: Insufficient documentation

## 2023-11-26 DIAGNOSIS — R0602 Shortness of breath: Secondary | ICD-10-CM | POA: Insufficient documentation

## 2023-11-26 MED ORDER — IBUPROFEN 200 MG PO TABS
400.0000 mg | ORAL_TABLET | Freq: Once | ORAL | Status: AC | PRN
  Administered 2023-11-26: 400 mg via ORAL
  Filled 2023-11-26: qty 2

## 2023-11-26 NOTE — ED Triage Notes (Signed)
Patient arrived with complaints of left flank pain after twisting to move a heavy object, states tonight began feeling short of breath and pain with deep inhalation.

## 2023-11-27 ENCOUNTER — Other Ambulatory Visit: Payer: Self-pay

## 2023-11-27 ENCOUNTER — Emergency Department (HOSPITAL_BASED_OUTPATIENT_CLINIC_OR_DEPARTMENT_OTHER)
Admission: EM | Admit: 2023-11-27 | Discharge: 2023-11-27 | Disposition: A | Discharge status: Home / Self Care | Payer: Self-pay | Attending: Emergency Medicine | Admitting: Emergency Medicine

## 2023-11-27 ENCOUNTER — Encounter (HOSPITAL_BASED_OUTPATIENT_CLINIC_OR_DEPARTMENT_OTHER): Payer: Self-pay

## 2023-11-27 DIAGNOSIS — S3992XA Unspecified injury of lower back, initial encounter: Secondary | ICD-10-CM | POA: Insufficient documentation

## 2023-11-27 DIAGNOSIS — M545 Low back pain, unspecified: Secondary | ICD-10-CM

## 2023-11-27 DIAGNOSIS — X500XXA Overexertion from strenuous movement or load, initial encounter: Secondary | ICD-10-CM | POA: Insufficient documentation

## 2023-11-27 MED ORDER — METHOCARBAMOL 500 MG PO TABS: 1000.0000 mg | ORAL_TABLET | Freq: Three times a day (TID) | ORAL | 0 refills | Status: AC | PRN

## 2023-11-27 MED ORDER — OXYCODONE HCL 5 MG PO TABS
5.0000 mg | ORAL_TABLET | Freq: Once | ORAL | Status: AC
  Administered 2023-11-27: 5 mg via ORAL
  Filled 2023-11-27: qty 1

## 2023-11-27 MED ORDER — METHOCARBAMOL 500 MG PO TABS
500.0000 mg | ORAL_TABLET | Freq: Once | ORAL | Status: AC
  Administered 2023-11-27: 500 mg via ORAL
  Filled 2023-11-27: qty 1

## 2023-11-27 MED ORDER — KETOROLAC TROMETHAMINE 15 MG/ML IJ SOLN
30.0000 mg | Freq: Once | INTRAMUSCULAR | Status: AC
  Administered 2023-11-27: 30 mg via INTRAVENOUS
  Filled 2023-11-27: qty 2

## 2023-11-27 MED ORDER — LIDOCAINE 5 % EX PTCH
1.0000 | MEDICATED_PATCH | CUTANEOUS | Status: DC
  Administered 2023-11-27: 1 via TRANSDERMAL
  Filled 2023-11-27: qty 1

## 2023-11-27 MED ORDER — LIDOCAINE 5 % EX PTCH: 1.0000 | MEDICATED_PATCH | CUTANEOUS | 0 refills | Status: AC

## 2023-11-27 NOTE — ED Triage Notes (Signed)
Patient arrives POV with complaints of left sided back/muscle pain. Rates pain a 8/10. Reports that he was seen at another ED overnight for the same and pain has worsened

## 2023-11-27 NOTE — ED Provider Notes (Signed)
[place] EMERGENCY DEPARTMENT AT [facility] Provider Note   CSN: [id number] Arrival date & time: 11/27/23  1041     History  Chief Complaint  Patient presents with   Back Pain    Jose Hall is a 38 y.o. male.  Patient presents to the emergency department for evaluation of lower back pain.  Patient states that he works in an [employer].  He was helping carry a bumper 2 days ago.  While moving this he twisted awkwardly.  He did not have pain immediately but later that night and the next morning his back tightened up.  He has pain in his lower to mid back, favoring the left side.  He has tried heat and massage at home without improvement.  He has taken over-the-counter medication, last dose was ibuprofen several hours ago. Patient denies warning symptoms of back pain including: fecal incontinence, urinary retention or overflow incontinence, night sweats, waking from sleep with back pain, unexplained fevers or weight loss, h/o cancer, IVDU, recent trauma.  Patient was previously at the [facility] emergency department.  He had an x-ray of the chest which was negative.  He left without being seen.        Home Medications Prior to Admission medications   Medication Sig Start Date End Date Taking? Authorizing Provider  clonazePAM (KLONOPIN) 0.5 MG tablet Take 1 tablet (0.5 mg total) by mouth 2 (two) times daily as needed for anxiety. 12/02/22   Ivonne Andrew, NP      Allergies    Patient has no known allergies.    Review of Systems   Review of Systems  Physical Exam Updated Vital Signs BP (!) 114/91 (BP Location: Left Arm)   Pulse 88   Temp 99.7 F (37.6 C) (Oral)   Resp 20   Ht 5\' 7"  (1.702 m)   Wt 78.9 kg   SpO2 99%   BMI 27.25 kg/m  Physical Exam Vitals and nursing note reviewed.  Constitutional:      Appearance: He is well-developed.  HENT:     Head: Normocephalic and atraumatic.  Eyes:     Conjunctiva/sclera: Conjunctivae normal.  Abdominal:      Palpations: Abdomen is soft.     Tenderness: There is no abdominal tenderness. There is no right CVA tenderness or left CVA tenderness.  Musculoskeletal:        General: Normal range of motion.     Cervical back: Normal range of motion. No tenderness. Normal range of motion.     Thoracic back: Spasms and tenderness present.     Lumbar back: Spasms and tenderness present.       Back:     Comments: No step-off noted with palpation of spine.  Patient tender to palpation with palpable spasm over the left paraspinous musculature over the lower T-spine and upper L-spine  Skin:    General: Skin is warm and dry.  Neurological:     Mental Status: He is alert.     Sensory: No sensory deficit.     Motor: No abnormal muscle tone.     Comments: 5/5 strength in entire lower extremities bilaterally. No sensation deficit.   Psychiatric:        Mood and Affect: Mood normal.     ED Results / Procedures / Treatments   Labs (all labs ordered are listed, but only abnormal results are displayed) Labs Reviewed - No data to display  EKG None  Radiology DG Chest 2 View Result  Date: 11/26/2023 CLINICAL DATA:  Left flank pain after twisting injury EXAM: CHEST - 2 VIEW COMPARISON:  04/02/2018 FINDINGS: Normal heart size and mediastinal contours. No acute infiltrate or edema. No effusion or pneumothorax. No acute osseous findings. IMPRESSION: Negative chest. Electronically Signed   By: Tiburcio Pea M.D.   On: 11/26/2023 06:19    Procedures Procedures    Medications Ordered in ED Medications  ketorolac (TORADOL) 15 MG/ML injection 30 mg (has no administration in time range)  oxyCODONE (Oxy IR/ROXICODONE) immediate release tablet 5 mg (has no administration in time range)  methocarbamol (ROBAXIN) tablet 500 mg (has no administration in time range)  lidocaine (LIDODERM) 5 % 1 patch (has no administration in time range)    ED Course/ Medical Decision Making/ A&P    Patient seen and examined.  History obtained directly from patient.    Labs/EKG: None ordered.  Imaging: None ordered. Considering imaging of the lumbar spine with x-ray or CT but no red flags today and feel this would be low yield.   Medications/Fluids: Ordered: IM Toradol, p.o. Robaxin, p.o. oxycodone 5 mg, Lidoderm patch  Most recent vital signs reviewed and are as follows: BP (!) 114/91 (BP Location: Left Arm)   Pulse 88   Temp 99.7 F (37.6 C) (Oral)   Resp 20   Ht 5\' 7"  (1.702 m)   Wt 78.9 kg   SpO2 99%   BMI 27.25 kg/m   Initial impression: Low to mid-back pain with radicular features  Home treatment plan: Patient was counseled on back pain precautions and advised to do activity as tolerated but avoid strenuous activity and do not lift, push, or pull heavy objects more than 10 pounds for the next week.  Patient counseled to use ice or heat on back as needed for pain and spasm.    Medications prescribed: Robaxin for muscle pain and spasm. Patient counseled on proper use of muscle relaxant medication.  They were requested not to drink alcohol, drive any vehicle, or do any dangerous activities while taking this medication due to potential drowsiness and unintended.  Patient verbalized understanding.  Return instructions discussed with patient: Urged to return with worsening severe pain, loss of bowel or bladder control, trouble walking, development of weakness in the legs, or with any other concerns.  Follow-up instructions discussed with patient: Patient urged to follow-up with PCP if pain does not improve with treatment and rest or if pain becomes recurrent.   1:48 PM patient tolerated medication well.  Reports that his back does not hurt quite as much when he takes a deep breath.  He is ready for discharge.                                Medical Decision Making Risk Prescription drug management.    Patient with back pain. No neurological deficits. Patient is ambulatory. No warning symptoms of back  pain including: fecal incontinence, urinary retention or overflow incontinence, night sweats, waking from sleep with back pain, unexplained fevers or weight loss, h/o cancer, IVDU, recent trauma. No concern for cauda equina, epidural abscess, or other serious cause of back pain. Conservative measures such as rest, ice/heat and pain medicine indicated with PCP follow-up if no improvement with conservative management.         Final Clinical Impression(s) / ED Diagnoses Final diagnoses:  Acute left-sided low back pain without sciatica  Back injury, initial encounter    Rx /  DC Orders ED Discharge Orders          Ordered    methocarbamol (ROBAXIN) 500 MG tablet  Every 8 hours PRN        11/27/23 1325    lidocaine (LIDODERM) 5 %  Every 24 hours        11/27/23 1325              Renne Crigler, PA-C 11/27/23 1348    Horton, Clabe Seal, DO 11/28/23 [phone]

## 2023-11-27 NOTE — Discharge Instructions (Signed)
Please read and follow all provided instructions.  Your diagnoses today include:  1. Acute left-sided low back pain without sciatica   2. Back injury, initial encounter     Tests performed today include: Vital signs - see below for your results today  Medications prescribed:  Robaxin (methocarbamol) - muscle relaxer medication  DO NOT drive or perform any activities that require you to be awake and alert because this medicine can make you drowsy.   Please use over-the-counter NSAID medications (ibuprofen, naproxen) or Tylenol (acetaminophen) as directed on the packaging for pain -- as long as you do not have any reasons avoid these medications. Reasons to avoid NSAID medications include: weak kidneys, a history of bleeding in your stomach or gut, or uncontrolled high blood pressure or previous heart attack. Reasons to avoid Tylenol include: liver problems or ongoing alcohol use. Never take more than 4000mg  or 8 Extra strength Tylenol in a 24 hour period.     Take any prescribed medications only as directed.  Home care instructions:  Follow any educational materials contained in this packet Please rest, use ice or heat on your back for the next several days Do not lift, push, pull anything more than 10 pounds for the next week  Follow-up instructions: Please follow-up with your primary care provider in the next 1 week for further evaluation of your symptoms.   Return instructions:  SEEK IMMEDIATE MEDICAL ATTENTION IF YOU HAVE: New numbness, tingling, weakness, or problem with the use of your arms or legs Severe back pain not relieved with medications Loss control of your bowels or bladder Increasing pain in any areas of the body (such as chest or abdominal pain) Shortness of breath, dizziness, or fainting.  Worsening nausea (feeling sick to your stomach), vomiting, fever, or sweats Any other emergent concerns regarding your health   Additional Information:  Your vital signs  today were: BP (!) 114/91 (BP Location: Left Arm)   Pulse 88   Temp 99.7 F (37.6 C) (Oral)   Resp 20   Ht 5\' 7"  (1.702 m)   Wt 78.9 kg   SpO2 99%   BMI 27.25 kg/m  If your blood pressure (BP) was elevated above 135/85 this visit, please have this repeated by your doctor within one month. --------------

## 2023-11-29 ENCOUNTER — Telehealth: Payer: Self-pay

## 2023-11-29 NOTE — Transitions of Care (Post Inpatient/ED Visit) (Unsigned)
   11/29/2023  Name: Jose Hall MRN: [id number] DOB: [birth year]  Today's TOC FU Call Status:   Patient's Name and Date of Birth confirmed.  Transition Care Management Follow-up Telephone Call Date of Discharge: 11/27/23 Discharge Facility: [facility] Type of Discharge: Emergency Department Reason for ED Visit: Other: How have you been since you were released from the hospital?: Better Any questions or concerns?: No  Items Reviewed: Did you receive and understand the discharge instructions provided?: Yes Medications obtained,verified, and reconciled?: Yes (Medications Reviewed) Any new allergies since your discharge?: No Dietary orders reviewed?: No Do you have support at home?: Yes People in Home: significant other Name of Support/Comfort Primary Source: Girlfriend  Medications Reviewed Today: Medications Reviewed Today     Reviewed by Veneta Penton, CMA (Certified Medical Assistant) on 11/29/23 at 1016  Med List Status: <None>   Medication Order Taking? Sig Documenting Provider Last Dose Status Informant  clonazePAM (KLONOPIN) 0.5 MG tablet [id number] No Take 1 tablet (0.5 mg total) by mouth 2 (two) times daily as needed for anxiety.  Patient not taking: Reported on 11/29/2023   Ivonne Andrew, NP Not Taking Active   lidocaine (LIDODERM) 5 % [id number] No Place 1 patch onto the skin daily. Remove & Discard patch within 12 hours or as directed by MD  Patient not taking: Reported on 11/29/2023   Renne Crigler, PA-C Not Taking Active   methocarbamol (ROBAXIN) 500 MG tablet [id number] Yes Take 2 tablets (1,000 mg total) by mouth every 8 (eight) hours as needed for muscle spasms. Renne Crigler, PA-C Taking Active             Home Care and Equipment/Supplies: Were Home Health Services Ordered?: No Any new equipment or medical supplies ordered?: No  Functional Questionnaire: Do you need assistance with bathing/showering or dressing?: No Do you need  assistance with meal preparation?: No Do you need assistance with eating?: No Do you have difficulty maintaining continence: No Do you need assistance with getting out of bed/getting out of a chair/moving?: No Do you have difficulty managing or taking your medications?: No  Follow up appointments reviewed: PCP Follow-up appointment confirmed?: Yes Date of PCP follow-up appointment?: 12/09/23 Specialist Hospital Follow-up appointment confirmed?: No Do you need transportation to your follow-up appointment?: No Do you understand care options if your condition(s) worsen?: Yes-patient verbalized understanding  SDOH Interventions Today    Flowsheet Row Most Recent Value  SDOH Interventions   Food Insecurity Interventions Intervention Not Indicated  Housing Interventions Intervention Not Indicated  Transportation Interventions Intervention Not Indicated       SIGNATURE Carrina Schoenberger, CMA

## 2023-12-09 ENCOUNTER — Inpatient Hospital Stay: Payer: Self-pay | Admitting: Nurse Practitioner

## 2023-12-13 ENCOUNTER — Inpatient Hospital Stay: Payer: Self-pay | Admitting: Nurse Practitioner
